# Patient Record
Sex: Female | Born: 1960
Health system: Southern US, Community
[De-identification: ages and names within clinical notes are randomized; demographics above are authoritative.]

## PROBLEM LIST (undated history)

## (undated) DIAGNOSIS — E785 Hyperlipidemia, unspecified: Secondary | ICD-10-CM

## (undated) DIAGNOSIS — Z8489 Family history of other specified conditions: Secondary | ICD-10-CM

## (undated) DIAGNOSIS — M1990 Unspecified osteoarthritis, unspecified site: Secondary | ICD-10-CM

## (undated) DIAGNOSIS — R112 Nausea with vomiting, unspecified: Secondary | ICD-10-CM

## (undated) DIAGNOSIS — J45909 Unspecified asthma, uncomplicated: Secondary | ICD-10-CM

## (undated) DIAGNOSIS — Z9889 Other specified postprocedural states: Secondary | ICD-10-CM

## (undated) DIAGNOSIS — I10 Essential (primary) hypertension: Secondary | ICD-10-CM

## (undated) DIAGNOSIS — F411 Generalized anxiety disorder: Secondary | ICD-10-CM

## (undated) DIAGNOSIS — T8859XA Other complications of anesthesia, initial encounter: Secondary | ICD-10-CM

## (undated) DIAGNOSIS — Z8719 Personal history of other diseases of the digestive system: Secondary | ICD-10-CM

## (undated) DIAGNOSIS — F17200 Nicotine dependence, unspecified, uncomplicated: Secondary | ICD-10-CM

## (undated) DIAGNOSIS — D649 Anemia, unspecified: Secondary | ICD-10-CM

## (undated) DIAGNOSIS — R002 Palpitations: Secondary | ICD-10-CM

## (undated) DIAGNOSIS — E042 Nontoxic multinodular goiter: Secondary | ICD-10-CM

## (undated) DIAGNOSIS — J438 Other emphysema: Secondary | ICD-10-CM

## (undated) DIAGNOSIS — M129 Arthropathy, unspecified: Secondary | ICD-10-CM

## (undated) DIAGNOSIS — T4145XA Adverse effect of unspecified anesthetic, initial encounter: Secondary | ICD-10-CM

## (undated) HISTORY — DX: Nicotine dependence, unspecified, uncomplicated: F17.200

## (undated) HISTORY — DX: Nontoxic multinodular goiter: E04.2

## (undated) HISTORY — DX: Personal history of other diseases of the digestive system: Z87.19

## (undated) HISTORY — DX: Hyperlipidemia, unspecified: E78.5

## (undated) HISTORY — DX: Essential (primary) hypertension: I10

## (undated) HISTORY — DX: Unspecified osteoarthritis, unspecified site: M19.90

## (undated) HISTORY — DX: Unspecified asthma, uncomplicated: J45.909

## (undated) HISTORY — DX: Generalized anxiety disorder: F41.1

## (undated) HISTORY — DX: Other emphysema: J43.8

## (undated) HISTORY — DX: Arthropathy, unspecified: M12.9

## (undated) HISTORY — PX: BREAST BIOPSY: SHX20

---

## 1996-12-11 HISTORY — PX: JOINT REPLACEMENT: SHX530

## 1998-05-09 ENCOUNTER — Inpatient Hospital Stay (HOSPITAL_COMMUNITY): Admission: AD | Admit: 1998-05-09 | Discharge: 1998-05-09 | Payer: Self-pay | Admitting: Obstetrics and Gynecology

## 1998-05-25 ENCOUNTER — Inpatient Hospital Stay (HOSPITAL_COMMUNITY): Admission: AD | Admit: 1998-05-25 | Discharge: 1998-05-27 | Payer: Self-pay | Admitting: *Deleted

## 1998-05-29 ENCOUNTER — Encounter (HOSPITAL_COMMUNITY): Admission: RE | Admit: 1998-05-29 | Discharge: 1998-08-27 | Payer: Self-pay | Admitting: Obstetrics and Gynecology

## 1998-07-12 ENCOUNTER — Other Ambulatory Visit: Admission: RE | Admit: 1998-07-12 | Discharge: 1998-07-12 | Payer: Self-pay | Admitting: Obstetrics and Gynecology

## 1999-12-28 ENCOUNTER — Ambulatory Visit (HOSPITAL_COMMUNITY): Admission: RE | Admit: 1999-12-28 | Discharge: 1999-12-28 | Payer: Self-pay | Admitting: Obstetrics and Gynecology

## 1999-12-28 ENCOUNTER — Encounter: Payer: Self-pay | Admitting: Obstetrics and Gynecology

## 2000-04-23 ENCOUNTER — Ambulatory Visit (HOSPITAL_BASED_OUTPATIENT_CLINIC_OR_DEPARTMENT_OTHER): Admission: RE | Admit: 2000-04-23 | Discharge: 2000-04-23 | Payer: Self-pay | Admitting: Surgery

## 2000-06-16 ENCOUNTER — Encounter: Payer: Self-pay | Admitting: Emergency Medicine

## 2000-06-16 ENCOUNTER — Emergency Department (HOSPITAL_COMMUNITY): Admission: EM | Admit: 2000-06-16 | Discharge: 2000-06-16 | Payer: Self-pay | Admitting: Emergency Medicine

## 2000-12-11 HISTORY — PX: OTHER SURGICAL HISTORY: SHX169

## 2001-06-03 ENCOUNTER — Ambulatory Visit (HOSPITAL_COMMUNITY): Admission: RE | Admit: 2001-06-03 | Discharge: 2001-06-03 | Payer: Self-pay | Admitting: Cardiology

## 2001-06-04 ENCOUNTER — Encounter: Payer: Self-pay | Admitting: Family Medicine

## 2001-06-19 ENCOUNTER — Ambulatory Visit (HOSPITAL_COMMUNITY): Admission: RE | Admit: 2001-06-19 | Discharge: 2001-06-19 | Payer: Self-pay | Admitting: Family Medicine

## 2001-06-19 ENCOUNTER — Encounter: Payer: Self-pay | Admitting: Family Medicine

## 2002-05-19 ENCOUNTER — Encounter: Admission: RE | Admit: 2002-05-19 | Discharge: 2002-05-19 | Payer: Self-pay | Admitting: Emergency Medicine

## 2002-05-19 ENCOUNTER — Encounter: Payer: Self-pay | Admitting: Emergency Medicine

## 2002-10-01 ENCOUNTER — Other Ambulatory Visit: Admission: RE | Admit: 2002-10-01 | Discharge: 2002-10-01 | Payer: Self-pay | Admitting: Obstetrics and Gynecology

## 2004-01-12 ENCOUNTER — Other Ambulatory Visit: Admission: RE | Admit: 2004-01-12 | Discharge: 2004-01-12 | Payer: Self-pay | Admitting: Obstetrics and Gynecology

## 2006-12-11 HISTORY — PX: CHOLECYSTECTOMY: SHX55

## 2007-07-01 ENCOUNTER — Observation Stay (HOSPITAL_COMMUNITY): Admission: EM | Admit: 2007-07-01 | Discharge: 2007-07-02 | Payer: Self-pay | Admitting: Emergency Medicine

## 2007-07-01 ENCOUNTER — Ambulatory Visit: Payer: Self-pay | Admitting: Internal Medicine

## 2007-07-01 ENCOUNTER — Encounter: Payer: Self-pay | Admitting: Internal Medicine

## 2007-07-01 LAB — CONVERTED CEMR LAB
Chloride: 105 meq/L
Cholesterol: 211 mg/dL
HDL: 42 mg/dL
Hemoglobin: 12.3 g/dL
Potassium: 3.6 meq/L
Sodium: 141 meq/L
Triglyceride fasting, serum: 158 mg/dL
WBC: 9.5 10*3/uL

## 2007-07-10 ENCOUNTER — Ambulatory Visit: Payer: Self-pay

## 2007-07-18 ENCOUNTER — Encounter: Payer: Self-pay | Admitting: Cardiology

## 2007-07-18 ENCOUNTER — Ambulatory Visit (HOSPITAL_COMMUNITY): Admission: RE | Admit: 2007-07-18 | Discharge: 2007-07-18 | Payer: Self-pay | Admitting: Internal Medicine

## 2007-07-18 ENCOUNTER — Ambulatory Visit: Payer: Self-pay

## 2007-07-19 ENCOUNTER — Ambulatory Visit: Payer: Self-pay | Admitting: Gastroenterology

## 2007-07-19 LAB — CONVERTED CEMR LAB
AST: 40 units/L — ABNORMAL HIGH (ref 0–37)
Albumin: 3.8 g/dL (ref 3.5–5.2)
Amylase: 64 units/L (ref 27–131)
Basophils Relative: 0.4 % (ref 0.0–1.0)
Bilirubin, Direct: 0.1 mg/dL (ref 0.0–0.3)
Eosinophils Relative: 8.4 % — ABNORMAL HIGH (ref 0.0–5.0)
HCT: 35.9 % — ABNORMAL LOW (ref 36.0–46.0)
Hemoglobin: 12.1 g/dL (ref 12.0–15.0)
Lipase: 35 units/L (ref 11.0–59.0)
Lymphocytes Relative: 28.9 % (ref 12.0–46.0)
Monocytes Absolute: 0.4 10*3/uL (ref 0.2–0.7)
Monocytes Relative: 8.4 % (ref 3.0–11.0)
Neutro Abs: 2.3 10*3/uL (ref 1.4–7.7)
Neutrophils Relative %: 53.9 % (ref 43.0–77.0)
Total Bilirubin: 0.9 mg/dL (ref 0.3–1.2)
Total Protein: 6.5 g/dL (ref 6.0–8.3)

## 2007-07-20 ENCOUNTER — Ambulatory Visit (HOSPITAL_COMMUNITY): Admission: RE | Admit: 2007-07-20 | Discharge: 2007-07-21 | Payer: Self-pay | Admitting: General Surgery

## 2007-07-20 ENCOUNTER — Encounter: Payer: Self-pay | Admitting: Gastroenterology

## 2007-07-20 ENCOUNTER — Encounter (INDEPENDENT_AMBULATORY_CARE_PROVIDER_SITE_OTHER): Payer: Self-pay | Admitting: General Surgery

## 2007-07-24 ENCOUNTER — Ambulatory Visit: Payer: Self-pay | Admitting: Gastroenterology

## 2007-08-05 ENCOUNTER — Ambulatory Visit: Payer: Self-pay | Admitting: Gastroenterology

## 2007-08-05 LAB — CONVERTED CEMR LAB
Bilirubin, Direct: 0.1 mg/dL (ref 0.0–0.3)
Total Protein: 6.3 g/dL (ref 6.0–8.3)

## 2007-08-23 ENCOUNTER — Ambulatory Visit: Payer: Self-pay | Admitting: Internal Medicine

## 2008-03-18 DIAGNOSIS — J438 Other emphysema: Secondary | ICD-10-CM | POA: Insufficient documentation

## 2008-03-18 DIAGNOSIS — Z8719 Personal history of other diseases of the digestive system: Secondary | ICD-10-CM | POA: Insufficient documentation

## 2008-03-18 DIAGNOSIS — F411 Generalized anxiety disorder: Secondary | ICD-10-CM | POA: Insufficient documentation

## 2008-03-18 DIAGNOSIS — Z96649 Presence of unspecified artificial hip joint: Secondary | ICD-10-CM

## 2008-03-18 DIAGNOSIS — J439 Emphysema, unspecified: Secondary | ICD-10-CM | POA: Insufficient documentation

## 2008-03-18 DIAGNOSIS — Z9089 Acquired absence of other organs: Secondary | ICD-10-CM | POA: Insufficient documentation

## 2008-03-18 DIAGNOSIS — M1990 Unspecified osteoarthritis, unspecified site: Secondary | ICD-10-CM | POA: Insufficient documentation

## 2008-03-18 DIAGNOSIS — E079 Disorder of thyroid, unspecified: Secondary | ICD-10-CM | POA: Insufficient documentation

## 2008-09-06 IMAGING — CR DG CHEST 1V PORT
1 series · 1 of 1 positions shown · non-contrast
Comparison: none

CLINICAL DATA: 45 year-old-male with chest pain. 
 PORTABLE CHEST - 1 VIEW ? 07/01/07:

[view not recorded]
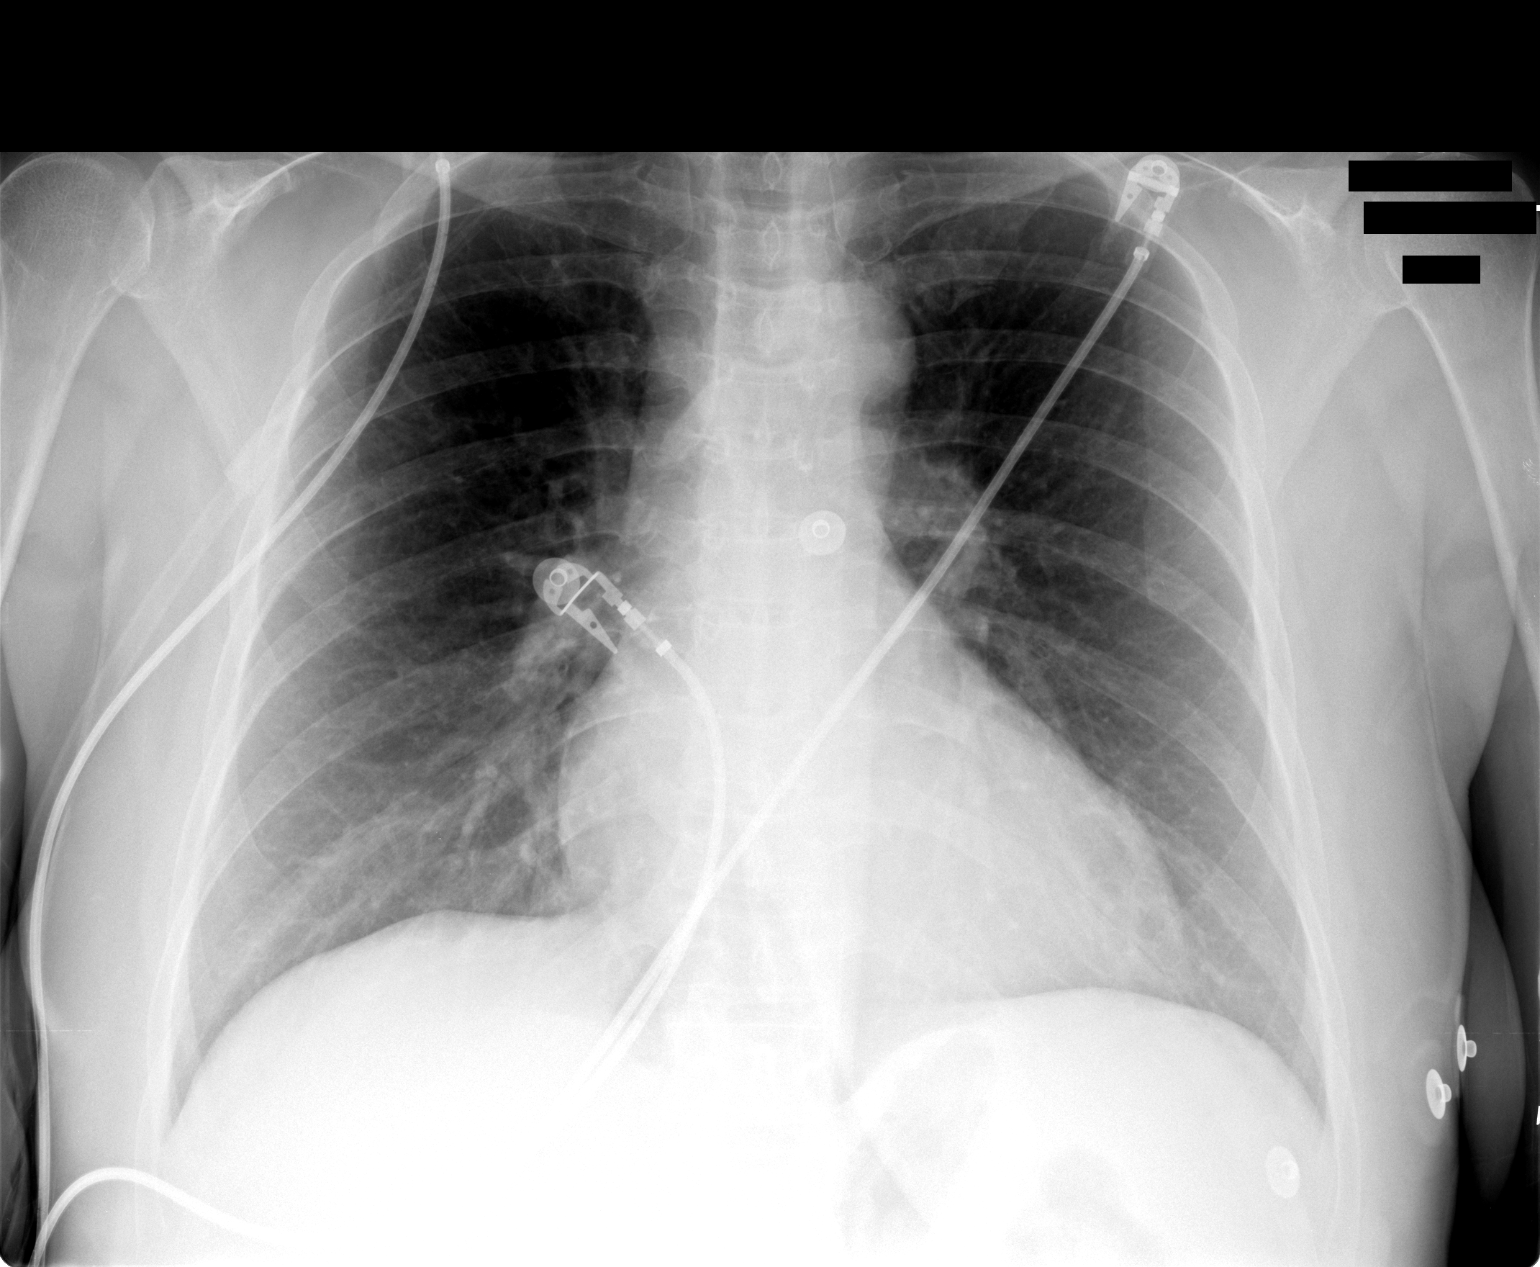

[1 of 1 positions shown; findings below may reference images not displayed]

FINDINGS: A single view of the chest demonstrates clear lungs.  The heart and mediastinum are within normal limits.  The trachea is midline.  The bony structures are intact without pneumothorax.
IMPRESSION: No acute chest findings.

## 2009-09-10 LAB — CONVERTED CEMR LAB: Pap Smear: NEGATIVE

## 2009-10-13 ENCOUNTER — Encounter: Payer: Self-pay | Admitting: Internal Medicine

## 2009-10-13 LAB — CONVERTED CEMR LAB
ALT: 11 units/L
Alkaline Phosphatase: 93 units/L
BUN: 21 mg/dL
Chloride: 101 meq/L
Cholesterol: 264 mg/dL
Glucose, Bld: 91 mg/dL
HDL: 36 mg/dL
LDL (calc): 188 mg/dL
Potassium: 4.2 meq/L
Total Bilirubin: 0.4 mg/dL
Triglyceride fasting, serum: 199 mg/dL

## 2009-11-09 ENCOUNTER — Encounter: Payer: Self-pay | Admitting: Internal Medicine

## 2009-11-09 DIAGNOSIS — E785 Hyperlipidemia, unspecified: Secondary | ICD-10-CM

## 2009-11-09 DIAGNOSIS — J45909 Unspecified asthma, uncomplicated: Secondary | ICD-10-CM | POA: Insufficient documentation

## 2009-11-09 DIAGNOSIS — F17200 Nicotine dependence, unspecified, uncomplicated: Secondary | ICD-10-CM

## 2009-11-10 ENCOUNTER — Ambulatory Visit: Payer: Self-pay | Admitting: Internal Medicine

## 2009-11-10 DIAGNOSIS — I10 Essential (primary) hypertension: Secondary | ICD-10-CM | POA: Insufficient documentation

## 2009-11-10 DIAGNOSIS — M129 Arthropathy, unspecified: Secondary | ICD-10-CM | POA: Insufficient documentation

## 2009-12-08 ENCOUNTER — Encounter: Payer: Self-pay | Admitting: Internal Medicine

## 2009-12-20 ENCOUNTER — Ambulatory Visit: Payer: Self-pay | Admitting: Internal Medicine

## 2010-02-09 ENCOUNTER — Ambulatory Visit: Payer: Self-pay | Admitting: Internal Medicine

## 2010-04-22 ENCOUNTER — Ambulatory Visit: Payer: Self-pay | Admitting: Internal Medicine

## 2010-04-22 ENCOUNTER — Encounter (INDEPENDENT_AMBULATORY_CARE_PROVIDER_SITE_OTHER): Payer: Self-pay | Admitting: *Deleted

## 2010-04-22 DIAGNOSIS — R110 Nausea: Secondary | ICD-10-CM

## 2010-04-24 ENCOUNTER — Emergency Department (HOSPITAL_COMMUNITY): Admission: EM | Admit: 2010-04-24 | Discharge: 2010-04-24 | Payer: Self-pay | Admitting: Emergency Medicine

## 2010-05-26 ENCOUNTER — Encounter (INDEPENDENT_AMBULATORY_CARE_PROVIDER_SITE_OTHER): Payer: Self-pay | Admitting: *Deleted

## 2010-10-19 ENCOUNTER — Ambulatory Visit: Payer: Self-pay | Admitting: Internal Medicine

## 2010-10-20 LAB — CONVERTED CEMR LAB
AST: 16 units/L (ref 0–37)
Alkaline Phosphatase: 72 units/L (ref 39–117)
BUN: 12 mg/dL (ref 6–23)
Bilirubin, Direct: 0.1 mg/dL (ref 0.0–0.3)
CO2: 33 meq/L — ABNORMAL HIGH (ref 19–32)
Calcium: 9.3 mg/dL (ref 8.4–10.5)
Chloride: 102 meq/L (ref 96–112)
Cholesterol: 200 mg/dL (ref 0–200)
Creatinine, Ser: 0.6 mg/dL (ref 0.4–1.2)
Glucose, Bld: 94 mg/dL (ref 70–99)
Total CHOL/HDL Ratio: 5
VLDL: 41 mg/dL — ABNORMAL HIGH (ref 0.0–40.0)

## 2011-01-10 NOTE — Letter (Signed)
Summary: New Patient letter  [facility]  [address] [place], [place] [address]   Phone: [phone]  Fax: [phone]       04/22/2010 MRN: [id number]  [name] [name] [address] [place], [place]  [address]  Dear Ms. Rosamaria Lints,  Welcome to the Gastroenterology Division at [facility].    You are scheduled to see Dr.   Christella Hartigan on May 31, 2010 at 10am on the 3rd floor at [facility], [address]  We ask that you try to arrive at our office 15 minutes prior to your appointment time to allow for check-in.  We would like you to complete the enclosed self-administered evaluation form prior to your visit and bring it with you on the day of your appointment.  We will review it with you.  Also, please bring a complete list of all your medications or, if you prefer, bring the medication bottles and we will list them.  Please bring your insurance card so that we may make a copy of it.  If your insurance requires a referral to see a specialist, please bring your referral form from your primary care physician.  Co-payments are due at the time of your visit and may be paid by cash, check or credit card.     Your office visit will consist of a consult with your physician (includes a physical exam), any laboratory testing he/she may order, scheduling of any necessary diagnostic testing (e.g. x-ray, ultrasound, CT-scan), and scheduling of a procedure (e.g. Endoscopy, Colonoscopy) if required.  Please allow enough time on your schedule to allow for any/all of these possibilities.    If you cannot keep your appointment, please call [phone] to cancel or reschedule prior to your appointment date.  This allows [place] the opportunity to schedule an appointment for another patient in need of care.  If you do not cancel or reschedule by 5 p.m. the business day prior to your appointment date, you will be charged a $50.00 late cancellation/no-show fee.    Thank you for choosing  [facility] for your medical needs.  We appreciate the opportunity to care for you.  Please visit [place] at our website  to learn more about our practice.                     Sincerely,                                                             The Gastroenterology Division

## 2011-01-10 NOTE — Assessment & Plan Note (Signed)
Summary: 6 MTH FU  STC   Vital Signs:  Patient profile:   50 year old female Height:      64.5 inches (163.83 cm) Weight:      180.5 pounds (82.05 kg) BMI:     30.61 O2 Sat:      96 % on Room air Temp:     97.3 degrees F (36.28 degrees C) oral Pulse rate:   73 / minute BP sitting:   112 / 78  (left arm) Cuff size:   large  Vitals Entered By: Orlan Leavens RMA (October 19, 2010 11:08 AM)  O2 Flow:  Room air CC: 6 MONTH FOLLOW-UP Is Patient Diabetic? No Pain Assessment Patient in pain? no      Comments Need refills on meds   Primary Care Provider:  Newt Lukes MD  CC:  6 MONTH FOLLOW-UP.  History of Present Illness: here for f/u  1) dyslipidemia -begun on statin 11/2009 - reports compliance with ongoing medical treatment and no changes in medication dose or frequency. denies adverse side effects related to current therapy. no new GI or mskel c/o  2) HTN - reports compliance with ongoing medical treatment and no changes in medication dose or frequency. denies adverse side effects related to current therapy. no CP, SOB or edema  3) smoker - on/off tobacco use for years denies any symptoms of cough or SOB - would like to quit - but not ready - remote hx asthma but no recent pulm flares or symptoms   4) occ nausea -  no abd pain, no v or bowel changes - feels maybe related to acid and reflux - improved with OTC meds for same - prior appt with GI but cancelled it x 2 in 2011  Clinical Review Panels:  Prevention   Last Mammogram:  No specific mammographic evidence of malignancy.   (09/10/2009)   Last Pap Smear:  Interpretation/Result:Negative for intraepithelial Lesion or Malignancy.    (09/10/2009)  Immunizations   Last Flu Vaccine:  Fluvax 3+ (11/10/2009)   Last Pneumovax:  Historical (12/12/2007)  Lipid Management   Cholesterol:  264 (10/13/2009)   LDL (bad choesterol):  40 (10/13/2009)   HDL (good cholesterol):  36 (10/13/2009)   Triglycerides:  199  (10/13/2009)  Complete Metabolic Panel   Glucose:  91 (10/13/2009)   Sodium:  142 (10/13/2009)   Potassium:  4.2 (10/13/2009)   Chloride:  101 (10/13/2009)   CO2:  30 (07/01/2007)   BUN:  21 (10/13/2009)   Creatinine:  0.77 (10/13/2009)   Albumin:  3.6 (08/05/2007)   Total Protein:  6.4 (10/13/2009)   Calcium:  9.7 (10/13/2009)   Total Bili:  0.4 (10/13/2009)   Alk Phos:  93 (10/13/2009)   SGPT (ALT):  11 (10/13/2009)   SGOT (AST):  10 (10/13/2009)   Current Medications (verified): 1)  Bayer Low Strength 81 Mg Tbec (Aspirin) .... Take 1 Po Qd 2)  Hydrochlorothiazide 12.5 Mg Tabs (Hydrochlorothiazide) .Marland Kitchen.. 1 By Mouth Once Daily 3)  Pravastatin Sodium 20 Mg Tabs (Pravastatin Sodium) .Marland Kitchen.. 1 By Mouth At Bedtime 4)  Pepcid 20 Mg Tabs (Famotidine) .Marland Kitchen.. 1 By Mouth Once Daily  Allergies (verified): 1)  ! Codeine  Past History:  Past Medical History: Asthma Hyperlipidemia Hypertension Vit D defic  MD roster: gyn - [facility] cards - [facility] - [facility]  Review of Systems  The patient denies fever, weight gain, chest pain, syncope, and peripheral edema.    Physical Exam  General:  overweight-appearing.  alert, well-developed, well-nourished, and cooperative to examination.    Neck:  slightly shoddy left cervical LNs x 3 (also tender over left jaw with ongoing dental eval and tx, implants) Lungs:  normal respiratory effort, no intercostal retractions or use of accessory muscles; normal breath sounds bilaterally - no crackles and no wheezes.    Heart:  normal rate, regular rhythm, no murmur, and no rub. BLE without edema.   Impression & Recommendations:  Problem # 1:  HYPERLIPIDEMIA (ICD-272.4)  started med tx for same 11/2009 - tol well  check labs now Her updated medication list for this problem includes:    Pravastatin Sodium 20 Mg Tabs (Pravastatin sodium) .Marland Kitchen... 1 by mouth at bedtime  Orders: TLB-Lipid Panel (80061-LIPID) Tobacco use cessation intermediate 3-10 minutes  ([address])  Labs Reviewed: SGOT: 10 (10/13/2009)   SGPT: 11 (10/13/2009)   HDL:36 (10/13/2009), 42 (07/01/2007)  LDL:188 (10/13/2009), 40 (60/45/4098)  Chol:264 (10/13/2009), 211 (07/01/2007)  Trig:199 (10/13/2009), 158 (07/01/2007)  Problem # 2:  HYPERTENSION (ICD-401.9)  Her updated medication list for this problem includes:    Hydrochlorothiazide 12.5 Mg Tabs (Hydrochlorothiazide) .Marland Kitchen... 1 by mouth once daily  Orders: TLB-BMP (Basic Metabolic Panel-BMET) (80048-METABOL) Tobacco use cessation intermediate 3-10 minutes ([address])  BP today: 112/78 Prior BP: 122/82 (04/22/2010)  Labs Reviewed: K+: 4.2 (10/13/2009) Creat: : 0.77 (10/13/2009)   Chol: 264 (10/13/2009)   HDL: 36 (10/13/2009)   LDL: 188 (10/13/2009)   TG: 199 (10/13/2009)  Problem # 3:  TOBACCO ABUSE (ICD-305.1)  5 minutes today spent on patient education regarding the unhealthy effects of continued tobacco abuse and encouragment of cessation including medical options available to help patient to quit smoking.   Orders: Tobacco use cessation intermediate 3-10 minutes ([address])  Complete Medication List: 1)  Bayer Low Strength 81 Mg Tbec (Aspirin) .... Take 1 po qd 2)  Hydrochlorothiazide 12.5 Mg Tabs (Hydrochlorothiazide) .Marland Kitchen.. 1 by mouth once daily 3)  Pravastatin Sodium 20 Mg Tabs (Pravastatin sodium) .Marland Kitchen.. 1 by mouth at bedtime 4)  Pepcid 20 Mg Tabs (Famotidine) .Marland Kitchen.. 1 by mouth once daily  Other Orders: TLB-Hepatic/Liver Function Pnl (80076-HEPATIC)  Patient Instructions: 1)  it was good to see you today.  2)  test(s) ordered today - your results will be posted on the phone tree for review in 48-72 hours from the time of test completion; call [phone] and enter your 9 digit MRN (listed above on this page, just below your name); if any changes need to be made or there are abnormal results, you will be contacted directly.  3)  work on giving up those cigarettes as planned - 12/2010 sounds good! 4)  Please schedule a  follow-up appointment in 6 months, sooner if problems.  5)  will plan to schedule a colonoscopy/sigmoidoscopy to help detect colon cancer in the next few visits - it is a recommended screening after age 7 Prescriptions: PRAVASTATIN SODIUM 20 MG TABS (PRAVASTATIN SODIUM) 1 by mouth at bedtime  #30 x 5   Entered by:   Orlan Leavens RMA   Authorized by:   Newt Lukes MD   Signed by:   Orlan Leavens RMA on 10/19/2010   Method used:   Electronically to        [facility] [address] #[id number]* (retail)       [address]       [place], [place]  [address]       Ph: [phone]  Fax: [phone]   RxID:   [id number] HYDROCHLOROTHIAZIDE 12.5 MG TABS (HYDROCHLOROTHIAZIDE) 1 by mouth once daily  #30 x 5   Entered by:   Orlan Leavens RMA   Authorized by:   Newt Lukes MD   Signed by:   Orlan Leavens RMA on 10/19/2010   Method used:   Electronically to        [facility] [address] [phone]* (retail)       [address]       [place], [place]  [address]       Ph: [phone]       Fax: [phone]   RxID:   [id number]    Orders Added: 1)  TLB-Lipid Panel [80061-LIPID] 2)  TLB-Hepatic/Liver Function Pnl [80076-HEPATIC] 3)  TLB-BMP (Basic Metabolic Panel-BMET) [80048-METABOL] 4)  Est. Patient Level IV [9[address]] 5)  Tobacco use cessation intermediate 3-10 minutes [[address]]

## 2011-01-10 NOTE — Assessment & Plan Note (Signed)
Summary: F2Y/CHEST PAIN      Allergies Added: ! CODEINE  Visit Type:  Follow-up 2 years Primary Provider:  Newt Lukes MD  CC:  arm pain pt went to urgent care. It was suggested that pt come in for a follow visit because it has been a long time. Pt does smoke 1/2 a pack a day.Marland Kitchen  History of Present Illness: Patient is a 50 year old with a history of chest pain, dyslipidemia, hypertension and tobacco use.  I saw her 2 years ago.  Pain at that time was probable GI as it improved after a cholecystectomy. She was recently seen in urgent care for arm/shoulder pain.  She said the pain was worse with movement of her L arm.  It has since gone away.  She denies ofther chest pains.  No wheezing.  No SOB.  She continues to smoke.  Current Medications (verified): 1)  Bayer Low Strength 81 Mg Tbec (Aspirin) .... Take 1 Po Qd 2)  Hydrochlorothiazide 12.5 Mg Tabs (Hydrochlorothiazide) .Marland Kitchen.. 1 By Mouth Once Daily 3)  Pravastatin Sodium 20 Mg Tabs (Pravastatin Sodium) .Marland Kitchen.. 1 By Mouth At Bedtime  Allergies (verified): 1)  ! Codeine  Past History:  Past Medical History: Last updated: 11/10/2009 Asthma Hyperlipidemia Hypertension  Past Surgical History: Last updated: 11/09/2009 Total hip replacement (right), 1998 Cholecystectomy Thyroid biopsy  Family History: Last updated: 11/10/2009 Family History of Alcoholism/Addiction (parent) Family History of Arthritis (parent) Family History of Colon CA 1st degree relative <60 (grandparent) Family History High cholesterol (both PARENTS) Family History Hypertension (both parents) Family History Lung cancer (grandparent)  mom age 49 dad age 70  no CAD or CVA  Social History: Last updated: 11/10/2009 Current Smoker married, lives with spouse and 2 sons 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  Negative to hte above problem except as noted.  Vital Signs:  Patient  profile:   50 year old female Height:      64.5 inches Weight:      183 pounds BMI:     31.04 Pulse rate:   70 / minute BP sitting:   112 / 76  (left arm) Cuff size:   large  Vitals Entered By: Burnett Kanaris, CNA (December 20, 2009 11:27 AM)  Physical Exam  Additional Exam:  HEENT:  Normocephalic, atraumatic. EOMI, PERRLA.  Neck: JVP is normal. No thyromegaly. No bruits.  Lungs: clear to auscultation. No rales no wheezes.  Heart: Regular rate and rhythm. Normal S1, S2. No S3.   No significant murmurs. PMI not displaced.  Abdomen:  Supple, nontender. Normal bowel sounds. No masses. No hepatomegaly.  Extremities:   Good distal pulses throughout. No lower extremity edema.  Musculoskeletal :moving all extremities.  Neuro:   alert and oriented x3.    EKG  Procedure date:  12/20/2009  Findings:      NSR.  71 bpm.  Impression & Recommendations:  Problem # 1:  CHEST PAIN (ICD-786.50) Pain very atypical.  Apperas to have been muscluloskeletal.  COntinue risk factor modification.  Problem # 2:  HYPERTENSION (ICD-401.9) Will need to be followed and have K followed. Her updated medication list for this problem includes:    Bayer Low Strength 81 Mg Tbec (Aspirin) .Marland Kitchen... Take 1 po qd    Hydrochlorothiazide 12.5 Mg Tabs (Hydrochlorothiazide) .Marland Kitchen... 1 by mouth once daily  Problem # 3:  HYPERLIPIDEMIA (ICD-272.4) LDL did increase significantly  over the past few years  (from 137 to 188).  She is now on Pravastatin which is reasonalbe given her fHx and her tobacco use.  Iwill also refer her to  dietary.  At some pt she may be able to back off. Her updated medication list for this problem includes:    Pravastatin Sodium 20 Mg Tabs (Pravastatin sodium) .Marland Kitchen... 1 by mouth at bedtime  Orders: Misc. Referral (Misc. Ref)  Problem # 4:  TOBACCO ABUSE (ICD-305.1) Counselled at length about risks and need to quit.  Other Orders: EKG w/ Interpretation ([address])  Patient Instructions: 1)  An  appointment for you to see the Dietician has been scheduled. Please keep your appointment or be sure to call if you need to reschedule. for high Cholesterol.

## 2011-01-10 NOTE — Letter (Signed)
Summary: Appt Reminder 2  [facility]  [address] [place], [place] [address]   Phone: [phone]  Fax: [phone]        May 26, 2010 MRN: [id number]    [name] [name] [address] [place], [place]  [address]    Dear [name] [name],   You have a return appointment with Dr. Jarold Motto on 06-07-10 at 1:45pm.  The appoinment on June 21 with Dr Christella Hartigan was a scheduling error and has been cancelled. Please remember to bring a complete list of the medicines you are taking, your insurance card and your co-pay.  If you have to cancel or reschedule this appointment, please call before 5:00 pm the evening before to avoid a cancellation fee.  If you have any questions or concerns, please call [phone].    Sincerely, [facility]

## 2011-01-10 NOTE — Assessment & Plan Note (Signed)
Summary: FU ON MEDS/ REFILLS /NWS   Vital Signs:  Patient profile:   50 year old female Height:      64.5 inches (163.83 cm) Weight:      185.4 pounds (84.27 kg) O2 Sat:      97 % on Room air Temp:     97.4 degrees F (36.33 degrees C) oral Pulse rate:   76 / minute BP sitting:   122 / 82  (left arm) Cuff size:   regular  Vitals Entered By: Orlan Leavens (Apr 22, 2010 1:49 PM)  O2 Flow:  Room air CC: follow-up on meds Is Patient Diabetic? No Pain Assessment Patient in pain? no        Primary Care Provider:  Newt Lukes MD  CC:  follow-up on meds.  History of Present Illness: here for f/u  1) dyslipidemia -begun on statin 11/2009 -reports compliance with ongoing medical treatment and no changes in medication dose or frequency. denies adverse side effects related to current therapy. no GI or mskel c/o  2) HTN - reports compliance with ongoing medical treatment and no changes in medication dose or frequency. denies adverse side effects related to current therapy. no CP, SOB or edema  3) smoker - on/off tobacco use for years denies any symptoms pof cough or SOB - would like to quit - but not sure she's ready - remote hx asthma but no recent pulm flares or symptoms   4) c/o continued daily nausea -  no abd pain, no v or bowel changes - feels maybe related to acid and reflux - not taking any OTC meds for same - has appt with GI but cancelled it - ?reschedule -   Clinical Review Panels:  Lipid Management   Cholesterol:  264 (10/13/2009)   LDL (bad choesterol):  40 (10/13/2009)   HDL (good cholesterol):  36 (10/13/2009)   Triglycerides:  199 (10/13/2009)   Current Medications (verified): 1)  Bayer Low Strength 81 Mg Tbec (Aspirin) .... Take 1 Po Qd 2)  Hydrochlorothiazide 12.5 Mg Tabs (Hydrochlorothiazide) .Marland Kitchen.. 1 By Mouth Once Daily 3)  Pravastatin Sodium 20 Mg Tabs (Pravastatin Sodium) .Marland Kitchen.. 1 By Mouth At Bedtime  Allergies (verified): 1)  ! Codeine  Past  History:  Past Medical History: Asthma Hyperlipidemia Hypertension Vit D defic  MD rooster: gyn - [facility] cards - [facility] - [facility]  Review of Systems  The patient denies anorexia, fever, weight loss, chest pain, headaches, and abdominal pain.    Physical Exam  General:  overweight-appearing.  alert, well-developed, well-nourished, and cooperative to examination.    Lungs:  normal respiratory effort, no intercostal retractions or use of accessory muscles; normal breath sounds bilaterally - no crackles and no wheezes.    Heart:  normal rate, regular rhythm, no murmur, and no rub. BLE without edema. Abdomen:  soft, non-tender, normal bowel sounds, no distention; no masses and no appreciable hepatomegaly or splenomegaly.     Impression & Recommendations:  Problem # 1:  HYPERTENSION (ICD-401.9)  Her updated medication list for this problem includes:    Hydrochlorothiazide 12.5 Mg Tabs (Hydrochlorothiazide) .Marland Kitchen... 1 by mouth once daily  BP today: 122/82 Prior BP: 112/76 (12/20/2009)  Labs Reviewed: K+: 4.2 (10/13/2009) Creat: : 0.77 (10/13/2009)   Chol: 264 (10/13/2009)   HDL: 36 (10/13/2009)   LDL: 188 (10/13/2009)   TG: 199 (10/13/2009)  Problem # 2:  HYPERLIPIDEMIA (ICD-272.4)  Her updated medication list for this problem includes:    Pravastatin Sodium 20  Mg Tabs (Pravastatin sodium) .Marland Kitchen... 1 by mouth at bedtime  Orders: TLB-Lipid Panel (80061-LIPID)  Labs Reviewed: SGOT: 10 (10/13/2009)   SGPT: 11 (10/13/2009)   HDL:36 (10/13/2009), 42 (07/01/2007)  LDL:188 (10/13/2009), 40 (16/09/9603)  Chol:264 (10/13/2009), 211 (07/01/2007)  Trig:199 (10/13/2009), 158 (07/01/2007)  Problem # 3:  NAUSEA (ICD-787.02)  Discussed symptom control.  try otc pepcid for poss GERD and refer to GI  Orders: Gastroenterology Referral (GI)  Complete Medication List: 1)  Bayer Low Strength 81 Mg Tbec (Aspirin) .... Take 1 po qd 2)  Hydrochlorothiazide 12.5 Mg Tabs (Hydrochlorothiazide) .Marland Kitchen..  1 by mouth once daily 3)  Pravastatin Sodium 20 Mg Tabs (Pravastatin sodium) .Marland Kitchen.. 1 by mouth at bedtime 4)  Pepcid 20 Mg Tabs (Famotidine) .Marland Kitchen.. 1 by mouth once daily  Other Orders: TLB-Hepatic/Liver Function Pnl (80076-HEPATIC)  Patient Instructions: 1)  it was good to see you today. 2)  return next week in AM fasting for labs only - your results will be posted on the phone tree for review in 48-72 hours from the time of test completion; call [phone] and enter your 9 digit MRN (listed above on this page, just below your name); if any changes need to be made or there are abnormal results, you will be contacted directly.  3)  refills on medications as discussed - 4)  take OTC pepcid once daily for stomach  5)  also we'll make referral to [facility]. Our office will contact you regarding this appointment once made.  6)  Please schedule a follow-up appointment in 6 months, sooner if problems.  Prescriptions: PRAVASTATIN SODIUM 20 MG TABS (PRAVASTATIN SODIUM) 1 by mouth at bedtime  #30 x 5   Entered by:   Orlan Leavens   Authorized by:   Newt Lukes MD   Signed by:   Orlan Leavens on 04/22/2010   Method used:   Electronically to        [facility] #[id number]* (retail)       [address]       [place], [place]  [address]       Ph: [phone]       Fax: [phone]   RxID:   [id number] HYDROCHLOROTHIAZIDE 12.5 MG TABS (HYDROCHLOROTHIAZIDE) 1 by mouth once daily  #30 x 5   Entered by:   Orlan Leavens   Authorized by:   Newt Lukes MD   Signed by:   Orlan Leavens on 04/22/2010   Method used:   Electronically to        [facility] #[id number]* (retail)       [address]       [place], [place]  [address]       Ph: [phone]       Fax: [phone]   RxID:   [id number]

## 2011-02-27 LAB — CBC
MCHC: 34.2 g/dL (ref 30.0–36.0)
MCV: 92 fL (ref 78.0–100.0)
RBC: 4.19 MIL/uL (ref 3.87–5.11)
RDW: 14.1 % (ref 11.5–15.5)

## 2011-02-27 LAB — BASIC METABOLIC PANEL
BUN: 9 mg/dL (ref 6–23)
CO2: 27 mEq/L (ref 19–32)
Calcium: 9 mg/dL (ref 8.4–10.5)
Chloride: 107 mEq/L (ref 96–112)
Creatinine, Ser: 0.63 mg/dL (ref 0.4–1.2)
Glucose, Bld: 106 mg/dL — ABNORMAL HIGH (ref 70–99)

## 2011-02-27 LAB — POCT CARDIAC MARKERS
Myoglobin, poc: 58 ng/mL (ref 12–200)
Troponin i, poc: <0.05 ng/mL (ref 0.00–0.09)
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
## 2011-04-25 NOTE — Assessment & Plan Note (Signed)
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 Tenny Craw for evaluation of chest and abdominal pain.   This patient was hospitalized on July 21 with acute onset of subxiphoid  abdominal pain with nausea and vomiting.  She had a complete cardiac  work-up including exercise testing that was unremarkable.  She was  empirically treated with Protonix but continues to have problems and has  recently increased her dose to twice a day.  Review of her chart from  the hospitalization shows that she had rather markedly abnormal liver  function tests at that time, an SGOT of 235 and an SGPT of 107 with a  normal alk phos and bilirubin.  CBC and metabolic profile otherwise was  unremarkable.  Cardiac enzymes were negative.  Ultrasound exam showed  multiple gallstones with a 7 mm common bile duct but no dilated  intrahepatic ducts.   Since her discharge she continues to have episodes of epigastric pain  lasting four to five hours with nausea and vomiting.  She is taking  twice a day Protonix.  Denies any previous or chronic reflux symptoms,  dysphagia, etc.  She has not had any clay-colored stools, dark urine,  icterus, fever, chills, or lower gastrointestinal problems.  The patient  also additionally had a 2-D echocardiogram obtained of her heart which  showed normal left ventricular function.   PAST MEDICAL HISTORY:  1. Remarkable for mild emphysema.  2. Hypercholesterolemia.  3. Chronic thyroid dysfunction.  4. Degenerative arthritis.  5. History of anxiety disorder.  6. She has had a previous right hip replacement.   MEDICATIONS:  Daily Effexor, Crestor, and twice a day Protonix.   ALLERGIES:  She denies drug allergies.   FAMILY HISTORY:  Remarkable for gallbladder disease in her grandmother.  Her grandmother also had colon cancer and her father apparently has had  colon polyps.  Her father additional has alcoholism.   SOCIAL HISTORY:  She is married, lives with her husband.  Has a college  degree.  She had not smoked in the last week but used to smoke rather  heavily.  She drinks alcohol socially.   REVIEW OF SYSTEMS:  Otherwise noncontributory.  She has not had her  menstrual period in eight months.  She denies any other cardiovascular,  pulmonary, genitourinary, neurologic, or orthopedic problems.   PHYSICAL EXAMINATION:  GENERAL:  She is a healthy-appearing white  female, appearing her stated age.  No acute distress.  VITAL SIGNS:  She is 5 feet 4 inches and weighs 170 pounds.  Blood  pressure 122/66, pulse 68 and regular.  I could not appreciate stigmata  of chronic liver disease or thyromegaly or lymphadenopathy.  CHEST:  Clear.  HEART:  Regular rhythm without murmurs, gallops or rubs.  ABDOMEN:  No hepatosplenomegaly, abdominal masses or tenderness.  Bowel  sounds were normal.  EXTREMITIES:  Unremarkable.  MENTAL STATUS:  Normal.   ASSESSMENT:  [name] [name] has symptomatic gallstones and probably passed a  gallstone recently with elevated liver function tests, slightly  prominent common bile duct.   RECOMMENDATIONS:  I have referred her to surgery for hopefully  cholecystectomy before she has complications from her cholelithiasis.  I  will repeat her liver function tests, CBC, amylase, and lipase today.  She may need to ERCP pre or post surgical procedure depending on  laboratory evaluation.  I have asked her to continue Protonix twice a  day.  I have given her some p.r.n. sublingual Levsin to use in the  interim.     Vania Rea. Jarold Motto, MD, [facility], FAGA  Electronically Signed    DRP/MedQ  DD: 07/19/2007  DT: 07/19/2007  Job #: [id number]   cc:   Pricilla Riffle, MD, [facility]
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
## 2011-04-25 NOTE — Op Note (Signed)
NAMEANALYA, [name] [name]                    ACCOUNT NO.:  [id number]   MEDICAL RECORD NO.:  [id number]          PATIENT TYPE:  OIB   LOCATION:  1535                         FACILITY:  [facility]   PHYSICIAN:  Anselm Pancoast. Weatherly, M.D.DATE OF BIRTH:  Oct 29, 1961   DATE OF PROCEDURE:  07/20/2007  DATE OF DISCHARGE:  07/21/2007                               OPERATIVE REPORT   PREOPERATIVE DIAGNOSIS:  Chronic cholecystitis and abnormal liver  function studies.   POSTOPERATIVE DIAGNOSIS:  Chronic cholecystitis with stones in the  common bile duct.   OPERATIONS:  Laparoscopic cholecystectomy with cholangiogram.   ANESTHESIA:  General anesthesia.   HISTORY:  The patient is a 50 year old Caucasian female who has had  intermittent episodes of upper abdominal and chest discomfort and  actually had an episode approximately 2 weeks ago when she was evaluated  with 24-hour cardiac evaluation at [facility]. No cardiac  abnormalities were noted and no definite mention was made since then she  has had an episode of pain and was referred for an ultrasound that  showed multiple small stones within her gallbladder.  She was referred  to our office yesterday by Dr. Sheryn Bison after he saw her and she  was seen in the office of Dr. Earlene Plater who scheduled her for an urgent  cholecystectomy today. Her liver function studies preoperatively are  markedly improved and her alkaline phosphatase is now 140 and her SGOT  is actually normal, still mildly elevated SGPT. She was given 3 grams  Unasyn, PAS stockings and taken to the operative suite.  The patient has  reviewed the little booklet about the ERCP and gallbladder detail and  understands that she may need ERCP if she does have common duct stones.   DESCRIPTION OF PROCEDURE:  Induction of general anesthesia endotracheal  tube, oral tube to the stomach and abdomen was prepped Betadine solution  and draped in sterile manner.  A small incision was made below the  umbilicus and upon entering the peritoneal cavity, pursestring suture  made and the camera inserted after Hasson cannula had been inserted.  She has a full gallbladder with adhesions up around it and is not  acutely inflamed.  The upper 10 mm trocar was placed subxiphoid area and  then two lateral 5 mm trocars were placed Dr. Ezzard Standing. The gallbladder  was retracted upward and outward with the adhesions were carefully taken  down. In the proximal portion of the gallbladder you could see definite  stones in the most distal portion of the cystic duct and these were  milked back into the gallbladder.  We carefully dissected the junction  of the gallbladder cystic duct and encompassed it with a right angle and  put a clip across it after the stones had been pushed back into the  gallbladder itself.  The cystic artery was identified, separated  carefully and then doubly clipped proximally, singly distally and  divided.  We had a nice window behind the cystic duct then made a little  small opening. The [facility] catheter was introduced.  The dye was injected  and on the distal portion of the common bile duct.  You could see three  or possibly four small stones. There was flow into the duodenum.  A  definite stones.  The intrahepatic radicles could be visualized and were  thought to be unremarkable.  The catheter was removed. The cystic duct  proximally was triply clipped and divided and then the gallbladder was  freed from its bed, placed into an EndoCatch bag and with the camera  switched to the upper 10 mm port withdrawn without problems.  I put an  additional figure-of-eight suture in the fascia at the umbilicus tied  both it and pursestring and anesthetized with Marcaine. The upper 10 mL  trocar I put  one stitch in the anterior fascia with figure-of-eight of 0 Vicryl and  the subcutaneous wounds were closed with 4-0 Vicryl.  Benzoin and Steri-  Strips on skin.  The patient tolerated procedure  nicely and we will  arrange for her to be seen by the gastroenterologist for ERCP probably  tomorrow.           ______________________________  Anselm Pancoast. Zachery Dakins, M.D.     WJW/MEDQ  D:  07/20/2007  T:  07/21/2007  Job:  [id number]   cc:   Vania Rea. Jarold Motto, MD, FACG, FACP, FAGA  [address]  [place]  [place] [address]

## 2011-04-25 NOTE — Assessment & Plan Note (Signed)
[facility]                            CARDIOLOGY OFFICE NOTE   NAME:[name], [name]                    MRN:          [id number]  DATE:08/23/2007                            DOB:          [birth year]    IDENTIFICATION:  The patient is a 50 year old woman who underwent  extensive evaluation for chest pain.  She was seen by cardiology back in  the summer and actually went on to see GI.  Went on to have a  cholecystectomy.  She presented today for return.   Since surgery, she has done well.  She denies chest pain.  She is  active.  No shortness of breath.   She continues to smoke about a pack per day.   CURRENT MEDICATIONS:  1. Effexor 1 daily.  2. Aspirin 81 mg daily.   PHYSICAL EXAM:  On exam, the patient is in no distress.  Blood pressure 124/88, pulse 76, weight 176 pounds.  LUNGS:  Clear.  CARDIAC:  Regular rate and rhythm.  S1, S2.  No S3.  No murmurs.  ABDOMEN:  Benign.  EXTREMITIES:  No edema.   IMPRESSION:  1. Chest pain, abdominal pain appear to be gastrointestinal with gall      stones, status post cholecystectomy.  2. Health care maintenance.  Counseled for greater than 20 minutes on      smoking.  We have reviewed her lipid panel.  Her LDL was 137.  I      think dietary measures would be in order.   I have encouraged her to seek continued followup for her risk factors,  given her the number for smoking cessation program at [facility] through the [employer], and also with website.   I will not set a definite followup unless her symptoms change.  I am  concerned that she is no longer on aspirin.  I would be happy to see her  if problems arise in the future.     [name] Riffle, MD, [facility]  Electronically Signed    PVR/MedQ  DD: 08/24/2007  DT: 08/25/2007  Job #: [id number]   cc:   [name] [name], M.D.

## 2011-04-26 ENCOUNTER — Other Ambulatory Visit: Payer: Self-pay | Admitting: Internal Medicine

## 2011-04-26 NOTE — Telephone Encounter (Signed)
Rx [2] Done. 

## 2011-04-27 ENCOUNTER — Other Ambulatory Visit: Payer: Self-pay | Admitting: *Deleted

## 2011-04-27 MED ORDER — PRAVASTATIN SODIUM 20 MG PO TABS: 20.0000 mg | ORAL_TABLET | Freq: Every day | ORAL | Status: DC

## 2011-04-27 MED ORDER — HYDROCHLOROTHIAZIDE 12.5 MG PO TABS: 12.5000 mg | ORAL_TABLET | Freq: Every day | ORAL | Status: DC

## 2011-07-28 ENCOUNTER — Encounter: Payer: Self-pay | Admitting: Internal Medicine

## 2011-09-25 LAB — LIPID PANEL
Cholesterol: 211 — ABNORMAL HIGH
HDL: 42

## 2011-09-25 LAB — COMPREHENSIVE METABOLIC PANEL
ALT: 187 — ABNORMAL HIGH
AST: 32
Albumin: 3.7
Alkaline Phosphatase: 73
BUN: 9
Calcium: 8.8
Chloride: 104
Chloride: 105
Creatinine, Ser: 0.6
GFR calc Af Amer: >60
GFR calc non Af Amer: >60
Glucose, Bld: 102 — ABNORMAL HIGH
Potassium: 3.6
Sodium: 140
Total Bilirubin: 0.6
Total Bilirubin: 0.8

## 2011-09-25 LAB — CARDIAC PANEL(CRET KIN+CKTOT+MB+TROPI)
Relative Index: 1.5
Total CK: 106
Troponin I: 0.01

## 2011-09-25 LAB — CBC
HCT: 35.7 — ABNORMAL LOW
Hemoglobin: 12.3
MCHC: 34.5
Platelets: 208
RDW: 14

## 2011-09-25 LAB — HEMOGLOBIN A1C: Hgb A1c MFr Bld: 5.5

## 2011-09-25 LAB — TSH: TSH: 1.032

## 2011-09-25 LAB — D-DIMER, QUANTITATIVE: D-Dimer, Quant: 0.29

## 2011-10-02 ENCOUNTER — Ambulatory Visit (INDEPENDENT_AMBULATORY_CARE_PROVIDER_SITE_OTHER): Payer: BC Managed Care – PPO | Admitting: Internal Medicine

## 2011-10-02 ENCOUNTER — Encounter: Payer: Self-pay | Admitting: Internal Medicine

## 2011-10-02 VITALS — BP 130/90 | HR 76 | Temp 98.2°F | Ht 65.0 in | Wt 182.4 lb

## 2011-10-02 DIAGNOSIS — Z23 Encounter for immunization: Secondary | ICD-10-CM

## 2011-10-02 DIAGNOSIS — Z79899 Other long term (current) drug therapy: Secondary | ICD-10-CM

## 2011-10-02 DIAGNOSIS — E669 Obesity, unspecified: Secondary | ICD-10-CM

## 2011-10-02 DIAGNOSIS — E079 Disorder of thyroid, unspecified: Secondary | ICD-10-CM

## 2011-10-02 DIAGNOSIS — I10 Essential (primary) hypertension: Secondary | ICD-10-CM

## 2011-10-02 DIAGNOSIS — E785 Hyperlipidemia, unspecified: Secondary | ICD-10-CM

## 2011-10-02 DIAGNOSIS — R079 Chest pain, unspecified: Secondary | ICD-10-CM

## 2011-10-02 MED ORDER — LOSARTAN POTASSIUM 25 MG PO TABS: 25.0000 mg | ORAL_TABLET | Freq: Every day | ORAL | Status: DC

## 2011-10-02 NOTE — Patient Instructions (Signed)
It was good to see you today. Test(s) ordered today. Your results will be called to you after review (48-72hours after test completion). If any changes need to be made, you will be notified at that time. we'll make referral for stress test at [facility]. Our office will contact you regarding appointment(s) once made. Start losartan for blood pressure in addition to other medications - Your prescription(s) have been submitted to your pharmacy. Please take as directed and contact our office if you believe you are having problem(s) with the medication(s).  Please schedule followup in 4-6 weeks for blood pressure check, call sooner if problems.

## 2011-10-02 NOTE — Assessment & Plan Note (Signed)
Started statin 10/2009 - prava Recheck now with LFTs 

## 2011-10-02 NOTE — Assessment & Plan Note (Signed)
Recent elevation DBP>SBP - also stress and anxiety symptoms present Add losartan to ongoing HCTZ for BP control BP Readings from Last 3 Encounters:  10/02/11 130/90  10/19/10 112/78  04/22/10 122/82

## 2011-10-02 NOTE — Assessment & Plan Note (Signed)
bx 2010 for nodule - reports optho requests TFT check Labs entered as requested

## 2011-10-02 NOTE — Progress Notes (Signed)
Subjective:    Patient ID: [name] [name], female    DOB: 18-Oct-1961, 50 y.o.   MRN: [id number]  HPI  here for follow up - reviewed chronic medical issues and multiple new concerns:  dyslipidemia -begun on statin 11/2009 - reports compliance with ongoing medical treatment and no changes in medication dose or frequency. denies adverse side effects related to current therapy. no new GI or mskel pain  HTN - recent increase in BP readings x 2 weeks -reports compliance with ongoing medical treatment and no changes in medication dose or frequency. denies adverse side effects related to current therapy. nocturnal CP x 2 weeks described as tightness , SOB or edema   smoker - ongoing. on/off tobacco use for years  denies any symptoms of cough or SOB -  would like to quit - but not ready -  remote hx asthma but no recent pulm flares or symptoms   occasional nausea - no abd pain, no bowel changes - feels maybe related to acid and reflux - improved with OTC meds (H2B) for same, but not taking for last 3 months - prior appt with GI but cancelled it x 2 in 2011   Past Medical History  Diagnosis Date  . ANXIETY   . ARTHRITIS   . ASTHMA   . CHOLECYSTECTOMY, LAPAROSCOPIC, HX OF   . DEGENERATIVE JOINT DISEASE   . EMPHYSEMA, MILD   . GALLBLADDER DISEASE, HX OF   . HIP REPLACEMENT, RIGHT, HX OF   . HYPERLIPIDEMIA   . HYPERTENSION   . TOBACCO ABUSE   . Vitamin D deficiency    Family History  Problem Relation Age of Onset  . Alcohol abuse Other   . Arthritis Other   . Lung cancer Other   . Hypertension Other   . Hyperlipidemia Other    History  Substance Use Topics  . Smoking status: Current Everyday Smoker  . 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 Alcohol Use: No    Review of Systems Constitutional: Negative for fever or unexpected weight change.  Respiratory: Negative for cough and  shortness of breath.   Cardiovascular: Negative for palpitations or syncope - see HPI  Gastrointestinal: Negative for abdominal pain, no vomiting.  Musculoskeletal: Negative for gait problem or joint swelling.  Skin: Negative for rash.  Neurological: Negative for dizziness or headache.  No other specific complaints in a complete review of systems (except as listed in HPI above).     Objective:   Physical Exam BP 130/90  Pulse 76  Temp(Src) 98.2 F (36.8 C) (Oral)  Ht 5\' 5"  (1.651 m)  Wt 182 lb 6.4 oz (82.736 kg)  BMI 30.35 kg/m2  SpO2 95% Wt Readings from Last 3 Encounters:  10/02/11 182 lb 6.4 oz (82.736 kg)  10/19/10 180 lb 8 oz (81.874 kg)  04/22/10 185 lb 6.4 oz (84.097 kg)   Constitutional: She is overweight. She appears well-developed and well-nourished. No distress.  Neck: Normal range of motion. Neck supple. No JVD present. No thyromegaly present.  Cardiovascular: Normal rate, regular rhythm and normal heart sounds.  No murmur heard. No BLE edema. Pulmonary/Chest: Effort normal and breath sounds normal. No respiratory distress. She has no wheezes.  Psychiatric: She has a mildly anxious mood and affect. Her behavior is normal. Judgment and thought content normal.   Lab Results  Component Value Date   WBC 5.9  04/24/2010   HGB 13.2 04/24/2010   HCT 38.6 04/24/2010   PLT 191 04/24/2010   GLUCOSE 94 10/19/2010   CHOL 200 10/19/2010   TRIG 205.0* 10/19/2010   HDL 39.20 10/19/2010   LDLDIRECT 137.0 10/19/2010   LDLCALC 40 10/13/2009   LDLCALC 188 10/13/2009   ALT 18 10/19/2010   AST 16 10/19/2010   NA 141 10/19/2010   K 4.1 10/19/2010   CL 102 10/19/2010   CREATININE 0.6 10/19/2010   BUN 12 10/19/2010   CO2 33* 10/19/2010   TSH 0.788 10/13/2009   INR 1.0 07/01/2007   HGBA1C  Value: 5.5 (NOTE)   The ADA recommends the following therapeutic goals for glycemic   control related to Hgb A1C measurement:   Goal of Therapy:   < 7.0% Hgb A1C   Action Suggested:  > 8.0% Hgb A1C   Ref:   Diabetes Care, 22, Suppl. 1, 1999 07/01/2007   EKG today: NSR, no ischemic changes    Assessment & Plan:  See problem list. Medications and labs reviewed today. Time spent with pt today 40 minutes, greater than 50% time spent counseling patient on hypertension, CRF and CP, anxiety and medication review. Also review of prior records including 2008 stress test and prior cardiac eval   chest pain, atypical - nocturnal symptoms associated with anxiety symptoms - multiple CRF so will also check stress myoview- last stress treadmill done 2008 - no ischemic change - resume daily h2b and continue med mgmt of crf - smoking cessation advised  Obesity - pt requests a1c to be checked for DM screening

## 2011-10-09 ENCOUNTER — Other Ambulatory Visit: Payer: Self-pay | Admitting: Internal Medicine

## 2011-10-09 ENCOUNTER — Other Ambulatory Visit: Payer: Self-pay | Admitting: *Deleted

## 2011-10-09 ENCOUNTER — Other Ambulatory Visit (INDEPENDENT_AMBULATORY_CARE_PROVIDER_SITE_OTHER): Payer: BC Managed Care – PPO

## 2011-10-09 DIAGNOSIS — E079 Disorder of thyroid, unspecified: Secondary | ICD-10-CM

## 2011-10-09 DIAGNOSIS — Z79899 Other long term (current) drug therapy: Secondary | ICD-10-CM

## 2011-10-09 DIAGNOSIS — E785 Hyperlipidemia, unspecified: Secondary | ICD-10-CM

## 2011-10-09 DIAGNOSIS — E669 Obesity, unspecified: Secondary | ICD-10-CM

## 2011-10-09 LAB — LIPID PANEL
Cholesterol: 200 mg/dL (ref 0–200)
HDL: 41.8 mg/dL (ref >39.00)
Total CHOL/HDL Ratio: 5
Triglycerides: 243 mg/dL — ABNORMAL HIGH (ref 0.0–149.0)

## 2011-10-09 LAB — HEPATIC FUNCTION PANEL
ALT: 17 U/L (ref 0–35)
AST: 16 U/L (ref 0–37)
Albumin: 4 g/dL (ref 3.5–5.2)
Total Bilirubin: 0.5 mg/dL (ref 0.3–1.2)

## 2011-10-09 LAB — T4, FREE: Free T4: 0.75 ng/dL (ref 0.60–1.60)

## 2011-10-09 LAB — LDL CHOLESTEROL, DIRECT: Direct LDL: 132 mg/dL

## 2011-10-09 LAB — TSH: TSH: 1.1 u[IU]/mL (ref 0.35–5.50)

## 2011-10-09 MED ORDER — HYDROCHLOROTHIAZIDE 12.5 MG PO TABS: 12.5000 mg | ORAL_TABLET | Freq: Every day | ORAL | Status: DC

## 2011-10-09 MED ORDER — PRAVASTATIN SODIUM 20 MG PO TABS: 20.0000 mg | ORAL_TABLET | Freq: Every day | ORAL | Status: DC

## 2011-10-19 ENCOUNTER — Ambulatory Visit (HOSPITAL_COMMUNITY): Payer: BC Managed Care – PPO | Attending: Internal Medicine | Admitting: Radiology

## 2011-10-19 ENCOUNTER — Encounter (HOSPITAL_COMMUNITY): Payer: BC Managed Care – PPO | Admitting: Radiology

## 2011-10-19 DIAGNOSIS — R5381 Other malaise: Secondary | ICD-10-CM | POA: Insufficient documentation

## 2011-10-19 DIAGNOSIS — E669 Obesity, unspecified: Secondary | ICD-10-CM

## 2011-10-19 DIAGNOSIS — J4489 Other specified chronic obstructive pulmonary disease: Secondary | ICD-10-CM | POA: Insufficient documentation

## 2011-10-19 DIAGNOSIS — J438 Other emphysema: Secondary | ICD-10-CM | POA: Insufficient documentation

## 2011-10-19 DIAGNOSIS — F17200 Nicotine dependence, unspecified, uncomplicated: Secondary | ICD-10-CM | POA: Insufficient documentation

## 2011-10-19 DIAGNOSIS — I10 Essential (primary) hypertension: Secondary | ICD-10-CM | POA: Insufficient documentation

## 2011-10-19 DIAGNOSIS — R0609 Other forms of dyspnea: Secondary | ICD-10-CM | POA: Insufficient documentation

## 2011-10-19 DIAGNOSIS — R0602 Shortness of breath: Secondary | ICD-10-CM

## 2011-10-19 DIAGNOSIS — E663 Overweight: Secondary | ICD-10-CM | POA: Insufficient documentation

## 2011-10-19 DIAGNOSIS — R0989 Other specified symptoms and signs involving the circulatory and respiratory systems: Secondary | ICD-10-CM | POA: Insufficient documentation

## 2011-10-19 DIAGNOSIS — R42 Dizziness and giddiness: Secondary | ICD-10-CM | POA: Insufficient documentation

## 2011-10-19 DIAGNOSIS — R110 Nausea: Secondary | ICD-10-CM | POA: Insufficient documentation

## 2011-10-19 DIAGNOSIS — R000 Tachycardia, unspecified: Secondary | ICD-10-CM | POA: Insufficient documentation

## 2011-10-19 DIAGNOSIS — E785 Hyperlipidemia, unspecified: Secondary | ICD-10-CM | POA: Insufficient documentation

## 2011-10-19 DIAGNOSIS — R0789 Other chest pain: Secondary | ICD-10-CM | POA: Insufficient documentation

## 2011-10-19 DIAGNOSIS — R079 Chest pain, unspecified: Secondary | ICD-10-CM

## 2011-10-19 DIAGNOSIS — J449 Chronic obstructive pulmonary disease, unspecified: Secondary | ICD-10-CM | POA: Insufficient documentation

## 2011-10-19 MED ORDER — TECHNETIUM TC 99M TETROFOSMIN IV KIT
11.0000 | PACK | Freq: Once | INTRAVENOUS | Status: AC | PRN
  Administered 2011-10-19: 11 via INTRAVENOUS

## 2011-10-19 MED ORDER — TECHNETIUM TC 99M TETROFOSMIN IV KIT
33.0000 | PACK | Freq: Once | INTRAVENOUS | Status: AC | PRN
  Administered 2011-10-19: 33 via INTRAVENOUS

## 2011-10-19 NOTE — Progress Notes (Signed)
[facility] SITE 3 NUCLEAR MED [address] [place] [address] [phone]  Cardiology Nuclear Med Study  [name] [name] is a 50 y.o. female [id number] 07-11-1961   Nuclear Med Background Indication for Stress Test:  Evaluation for Ischemia History:  Asthma, COPD,'08 Echo: EF: 60-70%, Emphysema and 07/08 Myocardial Perfusion Study:Normal. EF 68% Cardiac Risk Factors: Hypertension, Lipids, Overweight and Smoker  Symptoms:  Chest Pain, Chest Pressure, Chest Tightness, Dizziness, DOE, Fatigue, Nausea and Rapid HR   Nuclear Pre-Procedure Caffeine/Decaff Intake:  None NPO After: 8:30pm   Lungs: clear IV 0.9% NS with Angio Cath:  20g  IV Site: R Antecubital  IV Started by:  Bonnita Levan, RN  Chest Size (in):  38 Cup Size: D  Height: 5\' 5"  (1.651 m)  Weight:  178 lb (80.74 kg)  BMI:  Body mass index is 29.62 kg/(m^2). Tech Comments:  N/A    Nuclear Med Study 1 or 2 day study: 1 day  Stress Test Type:  Stress  Reading MD: Dietrich Pates, MD  Order Authorizing Provider:  Dr. Rene Paci  Resting Radionuclide: Technetium 13m Tetrofosmin  Resting Radionuclide Dose: 11.0 mCi   Stress Radionuclide:  Technetium 11m Tetrofosmin  Stress Radionuclide Dose: 33.0 mCi           Stress Protocol Rest HR: 62 Stress HR: 155  Rest BP: 110/69 Stress BP: 179/87  Exercise Time (min): 9:30 METS: 10.9   Predicted Max HR: 169 bpm % Max HR: 36.69 bpm Rate Pressure Product: 6820   Dose of Adenosine (mg):  n/a Dose of Lexiscan: n/a mg  Dose of Atropine (mg): n/a Dose of Dobutamine: n/a mcg/kg/min (at max HR)  Stress Test Technologist: Milana Na, EMT-P  Nuclear Technologist:  Domenic Polite, CNMT     Rest Procedure:  Myocardial perfusion imaging was performed at rest 45 minutes following the intravenous administration of Technetium 34m Tetrofosmin. Rest ECG: NSR  Stress Procedure:  The patient exercised for 9:30.  The patient stopped due to fatigue and denied any chest  pain.  There were no significant ST-T wave changes.  Technetium 78m Tetrofosmin was injected at peak exercise and myocardial perfusion imaging was performed after a brief delay. Stress ECG: No significant change from baseline ECG  QPS Raw Data Images:  Normal; no motion artifact; normal heart/lung ratio. Stress Images:  Normal perfusion with minimal apical thinning. Rest Images:  Comparison with the stress images reveals no significant change. Subtraction (SDS):  No evidence of ischemia. Transient Ischemic Dilatation (Normal <1.22):  0.92 Lung/Heart Ratio (Normal <0.45):  0.33  Quantitative Gated Spect Images QGS EDV:  81 ml QGS ESV:  25 ml QGS cine images:  NL LV Function; NL Wall Motion QGS EF: 70%  Impression Exercise Capacity:  Excellent exercise capacity. BP Response:  Normal blood pressure response. Clinical Symptoms:  No chest pain. ECG Impression:  No significant ST segment change suggestive of ischemia. Comparison with Prior Nuclear Study:  No significant change from previous Nuclear study. Overall Impression:  Normal stress nuclear study.  LVEF 70% Dietrich Pates

## 2012-02-08 ENCOUNTER — Ambulatory Visit (INDEPENDENT_AMBULATORY_CARE_PROVIDER_SITE_OTHER): Payer: BC Managed Care – PPO | Admitting: Internal Medicine

## 2012-02-08 ENCOUNTER — Encounter: Payer: Self-pay | Admitting: Internal Medicine

## 2012-02-08 VITALS — BP 110/68 | HR 80 | Temp 98.5°F | Resp 16 | Wt 187.0 lb

## 2012-02-08 DIAGNOSIS — F17200 Nicotine dependence, unspecified, uncomplicated: Secondary | ICD-10-CM

## 2012-02-08 DIAGNOSIS — J438 Other emphysema: Secondary | ICD-10-CM

## 2012-02-08 DIAGNOSIS — J209 Acute bronchitis, unspecified: Secondary | ICD-10-CM

## 2012-02-08 MED ORDER — PROMETHAZINE-DM 6.25-15 MG/5ML PO SYRP: 5.0000 mL | ORAL_SOLUTION | Freq: Four times a day (QID) | ORAL | Status: AC | PRN

## 2012-02-08 MED ORDER — CEFUROXIME AXETIL 500 MG PO TABS: 500.0000 mg | ORAL_TABLET | Freq: Two times a day (BID) | ORAL | Status: AC

## 2012-02-08 NOTE — Assessment & Plan Note (Signed)
She agrees to try to quit smoking and wants to try e-cigs to achieve that goal

## 2012-02-08 NOTE — Assessment & Plan Note (Signed)
Start ceftin for the infection and a cough suppressant 

## 2012-02-08 NOTE — Patient Instructions (Signed)

## 2012-02-08 NOTE — Progress Notes (Signed)
  Subjective:    Patient ID: [name] [name], female    DOB: 07-Oct-1961, 51 y.o.   MRN: [id number]  Cough This is a new problem. The current episode started in the past 7 days. The problem has been gradually worsening. The problem occurs every few hours. The cough is productive of purulent sputum. Associated symptoms include a sore throat. Pertinent negatives include no chest pain, chills, ear congestion, ear pain, fever, headaches, heartburn, hemoptysis, myalgias, nasal congestion, postnasal drip, rash, rhinorrhea, shortness of breath, sweats, weight loss or wheezing. The symptoms are aggravated by nothing. She has tried nothing for the symptoms. Her past medical history is significant for COPD and emphysema.      Review of Systems  Constitutional: Negative for fever, chills, weight loss, diaphoresis, activity change, appetite change, fatigue and unexpected weight change.  HENT: Positive for sore throat. Negative for ear pain, facial swelling, rhinorrhea, sneezing, drooling, mouth sores, trouble swallowing, neck pain, neck stiffness, dental problem, voice change, postnasal drip and sinus pressure.   Eyes: Negative.   Respiratory: Positive for cough. Negative for apnea, hemoptysis, choking, chest tightness, shortness of breath, wheezing and stridor.   Cardiovascular: Negative for chest pain, palpitations and leg swelling.  Gastrointestinal: Negative.  Negative for heartburn.  Genitourinary: Negative.   Musculoskeletal: Negative for myalgias, back pain, joint swelling, arthralgias and gait problem.  Skin: Negative for color change, pallor and rash.  Neurological: Negative for dizziness, tremors, seizures, syncope, facial asymmetry, speech difficulty, weakness, light-headedness, numbness and headaches.  Hematological: Negative for adenopathy. Does not bruise/bleed easily.  Psychiatric/Behavioral: Negative.        Objective:   Physical Exam  Vitals reviewed. Constitutional: She is oriented to  person, place, and time. She appears well-developed and well-nourished. No distress.  HENT:  Head: Normocephalic and atraumatic.  Mouth/Throat: Oropharynx is clear and moist. No oropharyngeal exudate.  Eyes: Conjunctivae are normal. Right eye exhibits no discharge. Left eye exhibits no discharge. No scleral icterus.  Neck: Normal range of motion. Neck supple. No JVD present. No tracheal deviation present. No thyromegaly present.  Cardiovascular: Normal rate, regular rhythm, normal heart sounds and intact distal pulses.  Exam reveals no gallop and no friction rub.   No murmur heard. Pulmonary/Chest: Effort normal and breath sounds normal. No stridor. No respiratory distress. She has no wheezes. She has no rales. She exhibits no tenderness.  Abdominal: Soft. Bowel sounds are normal. She exhibits no distension and no mass. There is no tenderness. There is no rebound and no guarding.  Musculoskeletal: Normal range of motion. She exhibits no edema and no tenderness.  Lymphadenopathy:    She has no cervical adenopathy.  Neurological: She is oriented to person, place, and time.  Skin: Skin is warm and dry. No rash noted. She is not diaphoretic. No erythema. No pallor.  Psychiatric: She has a normal mood and affect. Her behavior is normal. Judgment and thought content normal.          Assessment & Plan:

## 2012-09-17 ENCOUNTER — Other Ambulatory Visit: Payer: Self-pay | Admitting: *Deleted

## 2012-09-17 MED ORDER — HYDROCHLOROTHIAZIDE 12.5 MG PO TABS: 12.5000 mg | ORAL_TABLET | Freq: Every day | ORAL | Status: DC

## 2012-09-17 MED ORDER — PRAVASTATIN SODIUM 20 MG PO TABS: 20.0000 mg | ORAL_TABLET | Freq: Every day | ORAL | Status: DC

## 2012-09-17 NOTE — Telephone Encounter (Signed)
R'cd fax from [facility] for refill of Pravastatin and HCTZ.

## 2012-10-02 ENCOUNTER — Other Ambulatory Visit: Payer: Self-pay | Admitting: Internal Medicine

## 2012-10-09 ENCOUNTER — Other Ambulatory Visit: Payer: Self-pay | Admitting: *Deleted

## 2012-10-09 MED ORDER — LOSARTAN POTASSIUM 25 MG PO TABS: 25.0000 mg | ORAL_TABLET | Freq: Every day | ORAL | Status: DC

## 2012-11-01 ENCOUNTER — Other Ambulatory Visit: Payer: Self-pay | Admitting: Internal Medicine

## 2012-11-11 LAB — HM MAMMOGRAPHY

## 2013-01-05 ENCOUNTER — Other Ambulatory Visit: Payer: Self-pay | Admitting: Internal Medicine

## 2013-01-15 ENCOUNTER — Other Ambulatory Visit: Payer: Self-pay | Admitting: Internal Medicine

## 2013-01-15 ENCOUNTER — Other Ambulatory Visit: Payer: Self-pay | Admitting: *Deleted

## 2013-02-03 ENCOUNTER — Other Ambulatory Visit: Payer: Self-pay | Admitting: Internal Medicine

## 2013-02-05 ENCOUNTER — Telehealth: Payer: Self-pay | Admitting: Internal Medicine

## 2013-02-05 MED ORDER — PRAVASTATIN SODIUM 20 MG PO TABS: 20.0000 mg | ORAL_TABLET | Freq: Every day | ORAL | Status: DC

## 2013-02-05 MED ORDER — HYDROCHLOROTHIAZIDE 12.5 MG PO TABS: 12.5000 mg | ORAL_TABLET | Freq: Every day | ORAL | Status: DC

## 2013-02-05 NOTE — Telephone Encounter (Signed)
Notified pt 30 days sent to local pharmacy...[name] [name]

## 2013-02-05 NOTE — Telephone Encounter (Signed)
The pt is hoping to get a refill of her Pravastatin 20mg  and hydrocholorothiazide 12.5mg  refilled until her apt in mid-march.

## 2013-02-12 ENCOUNTER — Other Ambulatory Visit: Payer: Self-pay | Admitting: *Deleted

## 2013-02-12 MED ORDER — PRAVASTATIN SODIUM 20 MG PO TABS: 20.0000 mg | ORAL_TABLET | Freq: Every day | ORAL | Status: DC

## 2013-02-18 ENCOUNTER — Ambulatory Visit: Payer: BC Managed Care – PPO | Admitting: Internal Medicine

## 2013-02-18 DIAGNOSIS — Z0289 Encounter for other administrative examinations: Secondary | ICD-10-CM

## 2013-03-03 ENCOUNTER — Encounter: Payer: Self-pay | Admitting: Internal Medicine

## 2013-03-03 ENCOUNTER — Other Ambulatory Visit (INDEPENDENT_AMBULATORY_CARE_PROVIDER_SITE_OTHER): Payer: BC Managed Care – PPO

## 2013-03-03 ENCOUNTER — Ambulatory Visit (INDEPENDENT_AMBULATORY_CARE_PROVIDER_SITE_OTHER): Payer: BC Managed Care – PPO | Admitting: Internal Medicine

## 2013-03-03 VITALS — BP 140/82 | HR 71 | Temp 97.5°F | Wt 181.8 lb

## 2013-03-03 DIAGNOSIS — Z0000 Encounter for general adult medical examination without abnormal findings: Secondary | ICD-10-CM

## 2013-03-03 DIAGNOSIS — Z23 Encounter for immunization: Secondary | ICD-10-CM

## 2013-03-03 DIAGNOSIS — F17200 Nicotine dependence, unspecified, uncomplicated: Secondary | ICD-10-CM

## 2013-03-03 DIAGNOSIS — Z1211 Encounter for screening for malignant neoplasm of colon: Secondary | ICD-10-CM

## 2013-03-03 DIAGNOSIS — E785 Hyperlipidemia, unspecified: Secondary | ICD-10-CM

## 2013-03-03 DIAGNOSIS — I10 Essential (primary) hypertension: Secondary | ICD-10-CM

## 2013-03-03 LAB — URINALYSIS, ROUTINE W REFLEX MICROSCOPIC
Bilirubin Urine: NEGATIVE
Nitrite: NEGATIVE
Specific Gravity, Urine: 1.02 (ref 1.000–1.030)
pH: 5.5 (ref 5.0–8.0)

## 2013-03-03 LAB — HEPATIC FUNCTION PANEL
ALT: 19 U/L (ref 0–35)
AST: 20 U/L (ref 0–37)
Bilirubin, Direct: 0.1 mg/dL (ref 0.0–0.3)
Total Bilirubin: 0.5 mg/dL (ref 0.3–1.2)

## 2013-03-03 LAB — CBC WITH DIFFERENTIAL/PLATELET
Basophils Absolute: 0 10*3/uL (ref 0.0–0.1)
Lymphocytes Relative: 35 % (ref 12.0–46.0)
Monocytes Relative: 7 % (ref 3.0–12.0)
Neutrophils Relative %: 51.5 % (ref 43.0–77.0)
Platelets: 225 10*3/uL (ref 150.0–400.0)
RDW: 13.6 % (ref 11.5–14.6)

## 2013-03-03 LAB — BASIC METABOLIC PANEL
BUN: 13 mg/dL (ref 6–23)
Calcium: 9 mg/dL (ref 8.4–10.5)
GFR: 101.54 mL/min (ref >60.00)
Glucose, Bld: 104 mg/dL — ABNORMAL HIGH (ref 70–99)
Sodium: 139 mEq/L (ref 135–145)

## 2013-03-03 LAB — LIPID PANEL
Cholesterol: 152 mg/dL (ref 0–200)
LDL Cholesterol: 87 mg/dL (ref 0–99)
Total CHOL/HDL Ratio: 4
Triglycerides: 119 mg/dL (ref 0.0–149.0)
VLDL: 23.8 mg/dL (ref 0.0–40.0)

## 2013-03-03 MED ORDER — PRAVASTATIN SODIUM 20 MG PO TABS: 20.0000 mg | ORAL_TABLET | Freq: Every day | ORAL | Status: DC

## 2013-03-03 MED ORDER — LOSARTAN POTASSIUM 25 MG PO TABS: 25.0000 mg | ORAL_TABLET | Freq: Every day | ORAL | Status: DC

## 2013-03-03 MED ORDER — HYDROCHLOROTHIAZIDE 12.5 MG PO TABS: 12.5000 mg | ORAL_TABLET | Freq: Every day | ORAL | Status: DC

## 2013-03-03 NOTE — Patient Instructions (Signed)
It was good to see you today. Health Maintenance reviewed - tetanus boster updated today, remember to get her flu shot each fall -all other recommended immunizations and age-appropriate screenings are up-to-date. Test(s) ordered today. Your results will be released to MyChart (or called to you) after review, usually within 72hours after test completion. If any changes need to be made, you will be notified at that same time. Medications reviewed and updated, no changes at this time. Refill on medication(s) as discussed today. we'll make referral for colonoscopy screening. Our office will contact you regarding appointment(s) once made. Continue to think about giving up cigarettes! Use nicotine gum, nicotine patches or electronic cigarettes. If you're interested in medication to help you quit, please call  - let me know how I can help! Please schedule followup annually for physical and labs, call sooner if problems. Health Maintenance, Females A healthy lifestyle and preventative care can promote health and wellness.  Maintain regular health, dental, and eye exams.  Eat a healthy diet. Foods like vegetables, fruits, whole grains, low-fat dairy products, and lean protein foods contain the nutrients you need without too many calories. Decrease your intake of foods high in solid fats, added sugars, and salt. Get information about a proper diet from your caregiver, if necessary.  Regular physical exercise is one of the most important things you can do for your health. Most adults should get at least 150 minutes of moderate-intensity exercise (any activity that increases your heart rate and causes you to sweat) each week. In addition, most adults need muscle-strengthening exercises on 2 or more days a week.   Maintain a healthy weight. The body mass index (BMI) is a screening tool to identify possible weight problems. It provides an estimate of body fat based on height and weight. Your caregiver can help  determine your BMI, and can help you achieve or maintain a healthy weight. For adults 20 years and older:  A BMI below 18.5 is considered underweight.  A BMI of 18.5 to 24.9 is normal.  A BMI of 25 to 29.9 is considered overweight.  A BMI of 30 and above is considered obese.  Maintain normal blood lipids and cholesterol by exercising and minimizing your intake of saturated fat. Eat a balanced diet with plenty of fruits and vegetables. Blood tests for lipids and cholesterol should begin at age 70 and be repeated every 5 years. If your lipid or cholesterol levels are high, you are over 50, or you are a high risk for heart disease, you may need your cholesterol levels checked more frequently.Ongoing high lipid and cholesterol levels should be treated with medicines if diet and exercise are not effective.  If you smoke, find out from your caregiver how to quit. If you do not use tobacco, do not start.  If you are pregnant, do not drink alcohol. If you are breastfeeding, be very cautious about drinking alcohol. If you are not pregnant and choose to drink alcohol, do not exceed 1 drink per day. One drink is considered to be 12 ounces (355 mL) of beer, 5 ounces (148 mL) of wine, or 1.5 ounces (44 mL) of liquor.  Avoid use of street drugs. Do not share needles with anyone. Ask for help if you need support or instructions about stopping the use of drugs.  High blood pressure causes heart disease and increases the risk of stroke. Blood pressure should be checked at least every 1 to 2 years. Ongoing high blood pressure should be treated with  medicines, if weight loss and exercise are not effective.  If you are 67 to 53 years old, ask your caregiver if you should take aspirin to prevent strokes.  Diabetes screening involves taking a blood sample to check your fasting blood sugar level. This should be done once every 3 years, after age 28, if you are within normal weight and without risk factors for  diabetes. Testing should be considered at a younger age or be carried out more frequently if you are overweight and have at least 1 risk factor for diabetes.  Breast cancer screening is essential preventative care for women. You should practice "breast self-awareness." This means understanding the normal appearance and feel of your breasts and may include breast self-examination. Any changes detected, no matter how small, should be reported to a caregiver. Women in their 68s and 30s should have a clinical breast exam (CBE) by a caregiver as part of a regular health exam every 1 to 3 years. After age 54, women should have a CBE every year. Starting at age 73, women should consider having a mammogram (breast X-ray) every year. Women who have a family history of breast cancer should talk to their caregiver about genetic screening. Women at a high risk of breast cancer should talk to their caregiver about having an MRI and a mammogram every year.  The Pap test is a screening test for cervical cancer. Women should have a Pap test starting at age 94. Between ages 54 and 48, Pap tests should be repeated every 2 years. Beginning at age 57, you should have a Pap test every 3 years as long as the past 3 Pap tests have been normal. If you had a hysterectomy for a problem that was not cancer or a condition that could lead to cancer, then you no longer need Pap tests. If you are between ages 1 and 65, and you have had normal Pap tests going back 10 years, you no longer need Pap tests. If you have had past treatment for cervical cancer or a condition that could lead to cancer, you need Pap tests and screening for cancer for at least 20 years after your treatment. If Pap tests have been discontinued, risk factors (such as a new sexual partner) need to be reassessed to determine if screening should be resumed. Some women have medical problems that increase the chance of getting cervical cancer. In these cases, your caregiver  may recommend more frequent screening and Pap tests.  The human papillomavirus (HPV) test is an additional test that may be used for cervical cancer screening. The HPV test looks for the virus that can cause the cell changes on the cervix. The cells collected during the Pap test can be tested for HPV. The HPV test could be used to screen women aged 77 years and older, and should be used in women of any age who have unclear Pap test results. After the age of 63, women should have HPV testing at the same frequency as a Pap test.  Colorectal cancer can be detected and often prevented. Most routine colorectal cancer screening begins at the age of 29 and continues through age 69. However, your caregiver may recommend screening at an earlier age if you have risk factors for colon cancer. On a yearly basis, your caregiver may provide home test kits to check for hidden blood in the stool. Use of a small camera at the end of a tube, to directly examine the colon (sigmoidoscopy or colonoscopy), can  detect the earliest forms of colorectal cancer. Talk to your caregiver about this at age 69, when routine screening begins. Direct examination of the colon should be repeated every 5 to 10 years through age 35, unless early forms of pre-cancerous polyps or small growths are found.  Hepatitis C blood testing is recommended for all people born from [birth year] through 1966 and any individual with known risks for hepatitis C.  Practice safe sex. Use condoms and avoid high-risk sexual practices to reduce the spread of sexually transmitted infections (STIs). Sexually active women aged 51 and younger should be checked for Chlamydia, which is a common sexually transmitted infection. Older women with new or multiple partners should also be tested for Chlamydia. Testing for other STIs is recommended if you are sexually active and at increased risk.  Osteoporosis is a disease in which the bones lose minerals and strength with aging. This  can result in serious bone fractures. The risk of osteoporosis can be identified using a bone density scan. Women ages 77 and over and women at risk for fractures or osteoporosis should discuss screening with their caregivers. Ask your caregiver whether you should be taking a calcium supplement or vitamin D to reduce the rate of osteoporosis.  Menopause can be associated with physical symptoms and risks. Hormone replacement therapy is available to decrease symptoms and risks. You should talk to your caregiver about whether hormone replacement therapy is right for you.  Use sunscreen with a sun protection factor (SPF) of 30 or greater. Apply sunscreen liberally and repeatedly throughout the day. You should seek shade when your shadow is shorter than you. Protect yourself by wearing long sleeves, pants, a wide-brimmed hat, and sunglasses year round, whenever you are outdoors.  Notify your caregiver of new moles or changes in moles, especially if there is a change in shape or color. Also notify your caregiver if a mole is larger than the size of a pencil eraser.  Stay current with your immunizations. Document Released: 06/12/2011 Document Revised: 02/19/2012 Document Reviewed: 06/12/2011 [facility] Patient Information 2013 [place], [place]. You Can Quit Smoking If you are ready to quit smoking or are thinking about it, congratulations! You have chosen to help yourself be healthier and live longer! There are lots of different ways to quit smoking. Nicotine gum, nicotine patches, a nicotine inhaler, or nicotine nasal spray can help with physical craving. Hypnosis, support groups, and medicines help break the habit of smoking. TIPS TO GET OFF AND STAY OFF CIGARETTES  Learn to predict your moods. Do not let a bad situation be your excuse to have a cigarette. Some situations in your life might tempt you to have a cigarette.  Ask friends and co-workers not to smoke around you.  Make your home  smoke-free.  Never have "just one" cigarette. It leads to wanting another and another. Remind yourself of your decision to quit.  On a card, make a list of your reasons for not smoking. Read it at least the same number of times a day as you have a cigarette. Tell yourself everyday, "I do not want to smoke. I choose not to smoke."  Ask someone at home or work to help you with your plan to quit smoking.  Have something planned after you eat or have a cup of coffee. Take a walk or get other exercise to perk you up. This will help to keep you from overeating.  Try a relaxation exercise to calm you down and decrease your stress. Remember, you  may be tense and nervous the first two weeks after you quit. This will pass.  Find new activities to keep your hands busy. Play with a pen, coin, or rubber band. Doodle or draw things on paper.  Brush your teeth right after eating. This will help cut down the craving for the taste of tobacco after meals. You can try mouthwash too.  Try gum, breath mints, or diet candy to keep something in your mouth. IF YOU SMOKE AND WANT TO QUIT:  Do not stock up on cigarettes. Never buy a carton. Wait until one pack is finished before you buy another.  Never carry cigarettes with you at work or at home.  Keep cigarettes as far away from you as possible. Leave them with someone else.  Never carry matches or a lighter with you.  Ask yourself, "Do I need this cigarette or is this just a reflex?"  Bet with someone that you can quit. Put cigarette money in a piggy bank every morning. If you smoke, you give up the money. If you do not smoke, by the end of the week, you keep the money.  Keep trying. It takes 21 days to change a habit!  Talk to your doctor about using medicines to help you quit. These include nicotine replacement gum, lozenges, or skin patches. Document Released: 09/23/2009 Document Revised: 02/19/2012 Document Reviewed: 09/23/2009 [facility] Patient  Information 2013 [place], [place].

## 2013-03-03 NOTE — Assessment & Plan Note (Signed)
Recent elevation related stress and anxiety symptoms + variable med compliance resume losartan + HCTZ for BP control  BP Readings from Last 3 Encounters:  03/03/13 140/82  02/08/12 110/68  10/02/11 130/90

## 2013-03-03 NOTE — Assessment & Plan Note (Signed)
5 minutes today spent counseling patient on unhealthy effects of continued tobacco abuse and encouragement of cessation including medical options available to help the patient quit smoking. 

## 2013-03-03 NOTE — Progress Notes (Signed)
Subjective:    Patient ID: [name] [name], female    DOB: 27-Jul-1961, 52 y.o.   MRN: [id number]  HPI patient is here today for annual physical. Patient feels well overall.  Also reviewed chronic medical issues:  dyslipidemia -begun on statin 11/2009 - reports compliance with ongoing medical treatment and no changes in medication dose or frequency. denies adverse side effects related to current therapy.   HTN - recent increase in BP readings x 2 weeks with stress (furniture market) -reports compliance with ongoing medical treatment and no changes in medication dose or frequency. denies adverse side effects related to current therapy.  Past Medical History  Diagnosis Date  . ANXIETY   . ARTHRITIS   . ASTHMA   . DEGENERATIVE JOINT DISEASE   . EMPHYSEMA, MILD   . GALLBLADDER DISEASE, HX OF   . HYPERLIPIDEMIA   . HYPERTENSION   . TOBACCO ABUSE   . Vitamin D deficiency    Family History  Problem Relation Age of Onset  . Alcohol abuse Other   . Arthritis Other   . Lung cancer Other   . Hypertension Other   . Hyperlipidemia Other    History  Substance Use Topics  . Smoking status: Current Every Day Smoker  . 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 Alcohol Use: No    Review of Systems  Respiratory: Negative for cough and shortness of breath.   Cardiovascular: Negative for palpitations or syncope - Neurological: Negative for dizziness or headache.  No other specific complaints in a complete review of systems (except as listed in HPI above).      Objective:   Physical Exam  BP 140/82  Pulse 71  Temp(Src) 97.5 F (36.4 C) (Oral)  Wt 181 lb 12.8 oz (82.464 kg)  BMI 30.25 kg/m2  SpO2 96% Wt Readings from Last 3 Encounters:  03/03/13 181 lb 12.8 oz (82.464 kg)  02/08/12 187 lb (84.823 kg)  10/19/11 178 lb (80.74 kg)   Constitutional: She is overweight. She appears  well-developed and well-nourished. No distress.  HENT: Head: Normocephalic and atraumatic. Ears: B TMs ok, no erythema or effusion; Nose: Nose normal. Mouth/Throat: Oropharynx is clear and moist. No oropharyngeal exudate.  Eyes: Conjunctivae and EOM are normal. Pupils are equal, round, and reactive to light. No scleral icterus.  Neck: Normal range of motion. Neck supple. No JVD present. No thyromegaly present.  Cardiovascular: Normal rate, regular rhythm and normal heart sounds.  No murmur heard. No BLE edema. Pulmonary/Chest: Effort normal and breath sounds normal. No respiratory distress. She has no wheezes.  Abdominal: Soft. Bowel sounds are normal. She exhibits no distension. There is no tenderness. no masses Musculoskeletal: Normal range of motion, no joint effusions. No gross deformities Neurological: She is alert and oriented to person, place, and time. No cranial nerve deficit. Coordination normal.  Skin: Skin is warm and dry. No rash noted. No erythema.   Psychiatric: She has a mildly anxious mood and affect. Her behavior is normal. Judgment and thought content normal.   Lab Results  Component Value Date   WBC 5.9 04/24/2010   HGB 13.2 04/24/2010   HCT 38.6 04/24/2010   PLT 191 04/24/2010   GLUCOSE 94 10/19/2010   CHOL 200 10/09/2011   TRIG 243.0* 10/09/2011   HDL 41.80 10/09/2011   LDLDIRECT 132.0 10/09/2011   LDLCALC 40 10/13/2009   LDLCALC  188 10/13/2009   ALT 17 10/09/2011   AST 16 10/09/2011   NA 141 10/19/2010   K 4.1 10/19/2010   CL 102 10/19/2010   CREATININE 0.6 10/19/2010   BUN 12 10/19/2010   CO2 33* 10/19/2010   TSH 1.10 10/09/2011   INR 1.0 07/01/2007   HGBA1C 5.7 10/09/2011       Assessment & Plan:  CPX/v70.0 - Patient has been counseled on age-appropriate routine health concerns for screening and prevention. These are reviewed and up-to-date. Immunizations are up-to-date or declined. Labs ordered and reviewed.  Also see problem list. Medications and labs reviewed  today.

## 2013-03-03 NOTE — Assessment & Plan Note (Signed)
Started statin 10/2009 - prava Recheck now with LFTs 

## 2013-06-11 ENCOUNTER — Other Ambulatory Visit: Payer: Self-pay | Admitting: *Deleted

## 2013-06-11 MED ORDER — LOSARTAN POTASSIUM 25 MG PO TABS: 25.0000 mg | ORAL_TABLET | Freq: Every day | ORAL | Status: DC

## 2013-06-11 MED ORDER — HYDROCHLOROTHIAZIDE 12.5 MG PO TABS: 12.5000 mg | ORAL_TABLET | Freq: Every day | ORAL | Status: DC

## 2013-06-11 MED ORDER — PRAVASTATIN SODIUM 20 MG PO TABS: 20.0000 mg | ORAL_TABLET | Freq: Every day | ORAL | Status: DC

## 2013-06-11 NOTE — Telephone Encounter (Signed)
Pt called requesting refills for 90 supply on HCTZ, Losartin, Pravastatin be sent to [facility] due to insurance change.  Refills complete, pt notified,

## 2013-12-10 ENCOUNTER — Other Ambulatory Visit: Payer: Self-pay | Admitting: Internal Medicine

## 2014-03-09 ENCOUNTER — Other Ambulatory Visit: Payer: Self-pay | Admitting: Internal Medicine

## 2014-03-11 LAB — HM PAP SMEAR

## 2014-03-11 LAB — HM MAMMOGRAPHY

## 2014-06-23 ENCOUNTER — Encounter: Payer: Self-pay | Admitting: Internal Medicine

## 2014-06-23 ENCOUNTER — Ambulatory Visit (INDEPENDENT_AMBULATORY_CARE_PROVIDER_SITE_OTHER): Payer: BC Managed Care – PPO | Admitting: Internal Medicine

## 2014-06-23 VITALS — BP 120/68 | HR 75 | Temp 98.3°F | Ht 65.0 in | Wt 188.1 lb

## 2014-06-23 DIAGNOSIS — Z0000 Encounter for general adult medical examination without abnormal findings: Secondary | ICD-10-CM

## 2014-06-23 DIAGNOSIS — Z1211 Encounter for screening for malignant neoplasm of colon: Secondary | ICD-10-CM

## 2014-06-23 DIAGNOSIS — E559 Vitamin D deficiency, unspecified: Secondary | ICD-10-CM | POA: Insufficient documentation

## 2014-06-23 DIAGNOSIS — F17200 Nicotine dependence, unspecified, uncomplicated: Secondary | ICD-10-CM

## 2014-06-23 DIAGNOSIS — E785 Hyperlipidemia, unspecified: Secondary | ICD-10-CM

## 2014-06-23 DIAGNOSIS — I10 Essential (primary) hypertension: Secondary | ICD-10-CM

## 2014-06-23 MED ORDER — HYDROCHLOROTHIAZIDE 12.5 MG PO TABS: ORAL_TABLET | ORAL | Status: DC

## 2014-06-23 MED ORDER — PRAVASTATIN SODIUM 20 MG PO TABS: ORAL_TABLET | ORAL | Status: DC

## 2014-06-23 MED ORDER — LOSARTAN POTASSIUM 25 MG PO TABS: ORAL_TABLET | ORAL | Status: DC

## 2014-06-23 NOTE — Assessment & Plan Note (Signed)
5 minutes today spent counseling patient on unhealthy effects of continued tobacco abuse and encouragement of cessation including medical options available to help the patient quit smoking. Information given on chantix

## 2014-06-23 NOTE — Assessment & Plan Note (Signed)
Started statin 10/2009 - prava Recheck now with LFTs

## 2014-06-23 NOTE — Progress Notes (Signed)
Pre visit review using our clinic review tool, if applicable. No additional management support is needed unless otherwise documented below in the visit note. 

## 2014-06-23 NOTE — Progress Notes (Signed)
Subjective:    Patient ID: [name] [name], female    DOB: 03-19-1961, 53 y.o.   MRN: [id number]  HPI  patient is here today for annual physical. Patient feels well and has no complaints.   Also reviewed chronic medical issues and interval medical events  Past Medical History  Diagnosis Date  . ANXIETY   . ARTHRITIS   . ASTHMA   . DEGENERATIVE JOINT DISEASE   . EMPHYSEMA, MILD   . GALLBLADDER DISEASE, HX OF   . HYPERLIPIDEMIA   . HYPERTENSION   . TOBACCO ABUSE   . Vitamin D deficiency    Family History  Problem Relation Age of Onset  . Alcohol abuse Father 47    sober since age 7  . Arthritis Father   . Lung cancer Paternal Grandmother   . Hypertension Mother   . Hyperlipidemia Mother   . Hypertension Father   . Hyperlipidemia Father   . Rectal cancer Maternal Grandmother    History  Substance Use Topics  . Smoking status: Current Every Day Smoker  . 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 Alcohol Use: No    Review of Systems  Constitutional: Negative for fatigue and unexpected weight change.  Respiratory: Negative for cough, shortness of breath and wheezing.   Cardiovascular: Negative for chest pain, palpitations and leg swelling.  Gastrointestinal: Negative for nausea, abdominal pain and diarrhea.  Neurological: Negative for dizziness, weakness, light-headedness and headaches.  Psychiatric/Behavioral: Negative for dysphoric mood. The patient is not nervous/anxious.   All other systems reviewed and are negative.      Objective:   Physical Exam  BP 120/68  Pulse 75  Temp(Src) 98.3 F (36.8 C) (Oral)  Ht 5\' 5"  (1.651 m)  Wt 188 lb 2 oz (85.333 kg)  BMI 31.31 kg/m2  SpO2 96% Wt Readings from Last 3 Encounters:  06/23/14 188 lb 2 oz (85.333 kg)  03/03/13 181 lb 12.8 oz (82.464 kg)  02/08/12 187 lb (84.823 kg)   Constitutional: She appears well-developed  and well-nourished. No distress.  HENT: Head: Normocephalic and atraumatic. Ears: B TMs ok, no erythema or effusion; Nose: Nose normal. Mouth/Throat: Oropharynx is clear and moist. No oropharyngeal exudate.  Eyes: Conjunctivae and EOM are normal. Pupils are equal, round, and reactive to light. No scleral icterus.  Neck: Normal range of motion. Neck supple. No JVD present. No thyromegaly present.  Cardiovascular: Normal rate, regular rhythm and normal heart sounds.  No murmur heard. No BLE edema. Pulmonary/Chest: Effort normal and breath sounds normal. No respiratory distress. She has no wheezes.  Abdominal: Soft. Bowel sounds are normal. She exhibits no distension. There is no tenderness. no masses Musculoskeletal:  Feet with mild bunions and calluses. No open ulceration or sores. Normal range of motion, no joint effusions. No gross deformities Neurological: She is alert and oriented to person, place, and time. No cranial nerve deficit. Coordination, balance, strength, speech and gait are normal.  Skin: tan with solar changes. Skin is warm and dry. No rash noted. No erythema.  Psychiatric: She has a normal mood and affect. Her behavior is normal. Judgment and thought content normal.     Lab Results  Component Value Date   WBC 5.7 03/03/2013   HGB 13.2 03/03/2013   HCT 38.3 03/03/2013   PLT 225.0 03/03/2013   GLUCOSE 104* 03/03/2013   CHOL 152 03/03/2013  TRIG 119.0 03/03/2013   HDL 40.90 03/03/2013   LDLDIRECT 132.0 10/09/2011   LDLCALC 87 03/03/2013   ALT 19 03/03/2013   AST 20 03/03/2013   NA 139 03/03/2013   K 3.4* 03/03/2013   CL 102 03/03/2013   CREATININE 0.7 03/03/2013   BUN 13 03/03/2013   CO2 31 03/03/2013   TSH 0.59 03/03/2013   INR 1.0 07/01/2007   HGBA1C 5.7 10/09/2011    No results found.     Assessment & Plan:   CPX/v70.0 - Patient has been counseled on age-appropriate routine health concerns for screening and prevention. These are reviewed and up-to-date. Immunizations are  up-to-date or declined. Labs ordered and reviewed. Refer for colo  Problem List Items Addressed This Visit   HYPERLIPIDEMIA     Started statin 10/2009 - prava Recheck now with LFTs    Relevant Medications      hydrochlorothiazide (HYDRODIURIL) tablet      losartan (COZAAR) tablet      pravastatin (PRAVACHOL) tablet   HYPERTENSION      Hx related stress and anxiety symptoms + variable med compliance resumed losartan + HCTZ for BP control 02/2013 - doing well The current medical regimen is effective;  continue present plan and medications.  BP Readings from Last 3 Encounters:  06/23/14 120/68  03/03/13 140/82  02/08/12 110/68      Relevant Medications      hydrochlorothiazide (HYDRODIURIL) tablet      losartan (COZAAR) tablet      pravastatin (PRAVACHOL) tablet   TOBACCO ABUSE     5 minutes today spent counseling patient on unhealthy effects of continued tobacco abuse and encouragement of cessation including medical options available to help the patient quit smoking. Information given on chantix    Unspecified vitamin D deficiency   Relevant Orders      Vit D  25 hydroxy (rtn osteoporosis monitoring)    Other Visit Diagnoses   Routine general medical examination at a health care facility    -  Primary    Relevant Orders       Basic metabolic panel       CBC with Differential       Hepatic function panel       Lipid panel       TSH       Urinalysis, Routine w reflex microscopic    Special screening for malignant neoplasms, colon        Relevant Orders       Ambulatory referral to Gastroenterology

## 2014-06-23 NOTE — Assessment & Plan Note (Signed)
Hx related stress and anxiety symptoms + variable med compliance resumed losartan + HCTZ for BP control 02/2013 - doing well The current medical regimen is effective;  continue present plan and medications.  BP Readings from Last 3 Encounters:  06/23/14 120/68  03/03/13 140/82  02/08/12 110/68

## 2014-06-23 NOTE — Patient Instructions (Addendum)
It was good to see you today.  We have reviewed your prior records including labs and tests today  Health Maintenance reviewed - refer for colonoscopy - all other recommended immunizations and age-appropriate screenings are up-to-date.  Test(s) ordered today. Your results will be released to MyChart (or called to you) after review, usually within 72hours after test completion. If any changes need to be made, you will be notified at that same time.  Medications reviewed and updated, no changes recommended at this time. Refill on medication(s) as discussed today.  Please schedule followup in 12 months for annual exam and labs, call sooner if problems.  Health Maintenance, Female A healthy lifestyle and preventative care can promote health and wellness.  Maintain regular health, dental, and eye exams.  Eat a healthy diet. Foods like vegetables, fruits, whole grains, low-fat dairy products, and lean protein foods contain the nutrients you need without too many calories. Decrease your intake of foods high in solid fats, added sugars, and salt. Get information about a proper diet from your caregiver, if necessary.  Regular physical exercise is one of the most important things you can do for your health. Most adults should get at least 150 minutes of moderate-intensity exercise (any activity that increases your heart rate and causes you to sweat) each week. In addition, most adults need muscle-strengthening exercises on 2 or more days a week.   Maintain a healthy weight. The body mass index (BMI) is a screening tool to identify possible weight problems. It provides an estimate of body fat based on height and weight. Your caregiver can help determine your BMI, and can help you achieve or maintain a healthy weight. For adults 20 years and older:  A BMI below 18.5 is considered underweight.  A BMI of 18.5 to 24.9 is normal.  A BMI of 25 to 29.9 is considered overweight.  A BMI of 30 and above is  considered obese.  Maintain normal blood lipids and cholesterol by exercising and minimizing your intake of saturated fat. Eat a balanced diet with plenty of fruits and vegetables. Blood tests for lipids and cholesterol should begin at age 5 and be repeated every 5 years. If your lipid or cholesterol levels are high, you are over 50, or you are a high risk for heart disease, you may need your cholesterol levels checked more frequently.Ongoing high lipid and cholesterol levels should be treated with medicines if diet and exercise are not effective.  If you smoke, find out from your caregiver how to quit. If you do not use tobacco, do not start.  Lung cancer screening is recommended for adults aged 55-80 years who are at high risk for developing lung cancer because of a history of smoking. Yearly low-dose computed tomography (CT) is recommended for people who have at least a 30-pack-year history of smoking and are a current smoker or have quit within the past 15 years. A pack year of smoking is smoking an average of 1 pack of cigarettes a day for 1 year (for example: 1 pack a day for 30 years or 2 packs a day for 15 years). Yearly screening should continue until the smoker has stopped smoking for at least 15 years. Yearly screening should also be stopped for people who develop a health problem that would prevent them from having lung cancer treatment.  If you are pregnant, do not drink alcohol. If you are breastfeeding, be very cautious about drinking alcohol. If you are not pregnant and choose to drink  alcohol, do not exceed 1 drink per day. One drink is considered to be 12 ounces (355 mL) of beer, 5 ounces (148 mL) of wine, or 1.5 ounces (44 mL) of liquor.  Avoid use of street drugs. Do not share needles with anyone. Ask for help if you need support or instructions about stopping the use of drugs.  High blood pressure causes heart disease and increases the risk of stroke. Blood pressure should be  checked at least every 1 to 2 years. Ongoing high blood pressure should be treated with medicines, if weight loss and exercise are not effective.  If you are 48 to 54 years old, ask your caregiver if you should take aspirin to prevent strokes.  Diabetes screening involves taking a blood sample to check your fasting blood sugar level. This should be done once every 3 years, after age 48, if you are within normal weight and without risk factors for diabetes. Testing should be considered at a younger age or be carried out more frequently if you are overweight and have at least 1 risk factor for diabetes.  Breast cancer screening is essential preventative care for women. You should practice "breast self-awareness." This means understanding the normal appearance and feel of your breasts and may include breast self-examination. Any changes detected, no matter how small, should be reported to a caregiver. Women in their 71s and 30s should have a clinical breast exam (CBE) by a caregiver as part of a regular health exam every 1 to 3 years. After age 95, women should have a CBE every year. Starting at age 42, women should consider having a mammogram (breast X-ray) every year. Women who have a family history of breast cancer should talk to their caregiver about genetic screening. Women at a high risk of breast cancer should talk to their caregiver about having an MRI and a mammogram every year.  Breast cancer gene (BRCA)-related cancer risk assessment is recommended for women who have family members with BRCA-related cancers. BRCA-related cancers include breast, ovarian, tubal, and peritoneal cancers. Having family members with these cancers may be associated with an increased risk for harmful changes (mutations) in the breast cancer genes BRCA1 and BRCA2. Results of the assessment will determine the need for genetic counseling and BRCA1 and BRCA2 testing.  The Pap test is a screening test for cervical cancer. Women  should have a Pap test starting at age 45. Between ages 20 and 54, Pap tests should be repeated every 2 years. Beginning at age 50, you should have a Pap test every 3 years as long as the past 3 Pap tests have been normal. If you had a hysterectomy for a problem that was not cancer or a condition that could lead to cancer, then you no longer need Pap tests. If you are between ages 60 and 43, and you have had normal Pap tests going back 10 years, you no longer need Pap tests. If you have had past treatment for cervical cancer or a condition that could lead to cancer, you need Pap tests and screening for cancer for at least 20 years after your treatment. If Pap tests have been discontinued, risk factors (such as a new sexual partner) need to be reassessed to determine if screening should be resumed. Some women have medical problems that increase the chance of getting cervical cancer. In these cases, your caregiver may recommend more frequent screening and Pap tests.  The human papillomavirus (HPV) test is an additional test that may be  used for cervical cancer screening. The HPV test looks for the virus that can cause the cell changes on the cervix. The cells collected during the Pap test can be tested for HPV. The HPV test could be used to screen women aged 72 years and older, and should be used in women of any age who have unclear Pap test results. After the age of 73, women should have HPV testing at the same frequency as a Pap test.  Colorectal cancer can be detected and often prevented. Most routine colorectal cancer screening begins at the age of 32 and continues through age 79. However, your caregiver may recommend screening at an earlier age if you have risk factors for colon cancer. On a yearly basis, your caregiver may provide home test kits to check for hidden blood in the stool. Use of a small camera at the end of a tube, to directly examine the colon (sigmoidoscopy or colonoscopy), can detect the  earliest forms of colorectal cancer. Talk to your caregiver about this at age 62, when routine screening begins. Direct examination of the colon should be repeated every 5 to 10 years through age 58, unless early forms of pre-cancerous polyps or small growths are found.  Hepatitis C blood testing is recommended for all people born from [birth year] through 1966 and any individual with known risks for hepatitis C.  Practice safe sex. Use condoms and avoid high-risk sexual practices to reduce the spread of sexually transmitted infections (STIs). Sexually active women aged 49 and younger should be checked for Chlamydia, which is a common sexually transmitted infection. Older women with new or multiple partners should also be tested for Chlamydia. Testing for other STIs is recommended if you are sexually active and at increased risk.  Osteoporosis is a disease in which the bones lose minerals and strength with aging. This can result in serious bone fractures. The risk of osteoporosis can be identified using a bone density scan. Women ages 35 and over and women at risk for fractures or osteoporosis should discuss screening with their caregivers. Ask your caregiver whether you should be taking a calcium supplement or vitamin D to reduce the rate of osteoporosis.  Menopause can be associated with physical symptoms and risks. Hormone replacement therapy is available to decrease symptoms and risks. You should talk to your caregiver about whether hormone replacement therapy is right for you.  Use sunscreen. Apply sunscreen liberally and repeatedly throughout the day. You should seek shade when your shadow is shorter than you. Protect yourself by wearing long sleeves, pants, a wide-brimmed hat, and sunglasses year round, whenever you are outdoors.  Notify your caregiver of new moles or changes in moles, especially if there is a change in shape or color. Also notify your caregiver if a mole is larger than the size of a  pencil eraser.  Stay current with your immunizations. Document Released: 06/12/2011 Document Revised: 03/24/2013 Document Reviewed: 10/29/2013 [facility] Patient Information 2015 [place], [place]. This information is not intended to replace advice given to you by your health care provider. Make sure you discuss any questions you have with your health care provider.

## 2014-06-24 ENCOUNTER — Telehealth: Payer: Self-pay | Admitting: Internal Medicine

## 2014-06-24 NOTE — Telephone Encounter (Signed)
Relevant patient education mailed to patient.  

## 2014-08-26 ENCOUNTER — Encounter: Payer: Self-pay | Admitting: Internal Medicine

## 2014-12-17 ENCOUNTER — Ambulatory Visit (INDEPENDENT_AMBULATORY_CARE_PROVIDER_SITE_OTHER): Payer: BLUE CROSS/BLUE SHIELD | Admitting: *Deleted

## 2014-12-17 ENCOUNTER — Ambulatory Visit: Payer: BLUE CROSS/BLUE SHIELD

## 2014-12-17 DIAGNOSIS — Z23 Encounter for immunization: Secondary | ICD-10-CM

## 2015-06-03 LAB — HM COLONOSCOPY: HM Colonoscopy: NORMAL

## 2015-06-10 ENCOUNTER — Encounter: Payer: Self-pay | Admitting: Internal Medicine

## 2015-08-12 ENCOUNTER — Other Ambulatory Visit: Payer: Self-pay | Admitting: Obstetrics and Gynecology

## 2015-08-13 LAB — CYTOLOGY - PAP

## 2015-09-09 ENCOUNTER — Telehealth: Payer: Self-pay | Admitting: Internal Medicine

## 2015-09-09 ENCOUNTER — Other Ambulatory Visit: Payer: Self-pay | Admitting: Internal Medicine

## 2015-09-09 DIAGNOSIS — A481 Legionnaires' disease: Secondary | ICD-10-CM

## 2015-09-09 NOTE — Telephone Encounter (Signed)
[facility] - Client TELEPHONE ADVICE RECORD [facility] Patient Name: [name] [name] DOB: Apr 15, 1961 Initial Comment Caller states they stayed at a hotel recently and has been advised they may have been exposed to Legionnaires. Had some questions about this Nurse Assessment Nurse: Ladona Ridgel, RN, Sunny Schlein Date/Time (Eastern Time): 09/09/2015 4:31:28 PM Confirm and document reason for call. If symptomatic, describe symptoms. ---PT was watching the news and it said that she was exposed to legionnaires d. It smelled bad in the room and she has recieved emails from hotel per [facility] requirement. She knows the symptoms. Her only symptoms is a little muscle ache which is her usual. It was around August 10th. Has the patient traveled out of the country within the last 30 days? ---No Does the patient require triage? ---Yes Related visit to physician within the last 2 weeks? ---No Does the PT have any chronic conditions? (i.e. diabetes, asthma, etc.) ---NoGuidelines Guideline Title Affirmed Question Affirmed Notes Final Disposition User Clinical Call Gaddy, RN, Felicia Comments NO quidleline for legionnaires' d exposure. Pt has no symptoms. Her only risk factor is that she was a smoker and quit 6 months ago. Shared info from [facility] and told her call back for any symptoms. Also told her that fatigue can be a symptom of pneumonia - sometimes in individuals that do not know they have pneumonia. So call back for fatigue. She already has list of symptoms to watch for from [facility]. Causes and Common Sources of Infection Legionella is a type of bacterium found naturally in freshwater environments, like lakes and streams. It can become a health concern when it grows and spreads in human-made water systems like Hot tubs that arent drained after each use Hot water tanks and heaters Large plumbing systems Cooling towers (airconditioning units for large buildings) Decorative  fountains Home and car air-conditioning units do not use water to cool the air, so they are not a risk for Legionella growth. This bacterium grows best in warm water. How it Spreads After Legionella grows and multiplies in a building water system, that contaminated water then has to spread in droplets small enough for people to breathe in. People are exposed to Legionella when they She asked if there are tests for this: Diagnosis, Testing, and Treatment the only way to tell if a pneumonia patient has Legionnaires disease is by getting a specific diagnostic test. Indications that warrant testing include: Patients who have failed outpatient antibiotic therapy for community-acquired pneumonia Patients with severe pneumonia, PLEASE NOTE: All timestamps contained within this report are represented as [place] Standard Time. Call Id: [id number] Comments in particular those requiring intensive care Immunocompromised patients with pneumonia Patients with pneumonia in the setting of a Legionnaires disease outbreak Patients with a travel history within 2 weeks before the onset of illness referenced: http:// [employer]xC[employer].hu.htmlPLEASE NOTE: All timestamps contained within this report are represented as [place] Standard Time. Call Id: [id number]  No fever, No cough and not SOB (only thing is slightly achy in muscles in her legs but she thinks that is being back at work and walking - no HA, no congestion reached [place] nurse in the office to see what Dr. Felicity Coyer things about this - The nurse said she can come and be tested. Come to lab at her convenience tom or next week and they will test antigen to see if she came in contact. She said if it is positive they will culture it to confirm. These tests take about 72 h to  come back. Gave this information to Pt and she said she will do this. Told her Lab is open 7:30 am to 5 pm Clarification to prelimary note - she stayed  in the hotel whereshe could've been exposed to Legionnaires' around August 10th

## 2015-09-09 NOTE — Telephone Encounter (Signed)
[facility] called and stated that pt received a letter from a hotel and it stated she may have been exposed to legionella.

## 2015-09-09 NOTE — Telephone Encounter (Signed)
Pt called stated that her insurance is not going to pay for it unless its 3 month supply, please change the rx.

## 2015-09-09 NOTE — Telephone Encounter (Signed)
Test has been ordered. Pt will be down in the morning to have the test drawn.

## 2015-09-10 ENCOUNTER — Telehealth: Payer: Self-pay | Admitting: *Deleted

## 2015-09-10 MED ORDER — PRAVASTATIN SODIUM 20 MG PO TABS: 20.0000 mg | ORAL_TABLET | Freq: Every day | ORAL | Status: DC

## 2015-09-10 MED ORDER — HYDROCHLOROTHIAZIDE 12.5 MG PO TABS: 12.5000 mg | ORAL_TABLET | Freq: Every day | ORAL | Status: DC

## 2015-09-10 MED ORDER — LOSARTAN POTASSIUM 25 MG PO TABS: 25.0000 mg | ORAL_TABLET | Freq: Every day | ORAL | Status: DC

## 2015-09-10 NOTE — Telephone Encounter (Signed)
Receive call pt states nurse sent in 30 day on medications, but due to insurance will only cover 90. Pt has made appt to see md 11/24/15, Inform pt will resend for 90, but will have to keep appt for additional refills...Raechel Chute

## 2015-09-13 ENCOUNTER — Other Ambulatory Visit: Payer: BLUE CROSS/BLUE SHIELD

## 2015-09-13 DIAGNOSIS — A481 Legionnaires' disease: Secondary | ICD-10-CM

## 2015-09-16 LAB — LEGIONELLA PNEUMOPHILA TOTAL AB: Serogroups 2,3,4,5,6,8: <1:16 {titer}

## 2015-11-24 ENCOUNTER — Other Ambulatory Visit (INDEPENDENT_AMBULATORY_CARE_PROVIDER_SITE_OTHER): Payer: BLUE CROSS/BLUE SHIELD

## 2015-11-24 ENCOUNTER — Ambulatory Visit (INDEPENDENT_AMBULATORY_CARE_PROVIDER_SITE_OTHER): Payer: BLUE CROSS/BLUE SHIELD | Admitting: Internal Medicine

## 2015-11-24 ENCOUNTER — Encounter: Payer: Self-pay | Admitting: Internal Medicine

## 2015-11-24 VITALS — BP 128/78 | HR 70 | Temp 97.6°F | Ht 65.0 in | Wt 188.0 lb

## 2015-11-24 DIAGNOSIS — E785 Hyperlipidemia, unspecified: Secondary | ICD-10-CM | POA: Diagnosis not present

## 2015-11-24 DIAGNOSIS — R7989 Other specified abnormal findings of blood chemistry: Secondary | ICD-10-CM

## 2015-11-24 DIAGNOSIS — E669 Obesity, unspecified: Secondary | ICD-10-CM | POA: Insufficient documentation

## 2015-11-24 DIAGNOSIS — E042 Nontoxic multinodular goiter: Secondary | ICD-10-CM | POA: Diagnosis not present

## 2015-11-24 DIAGNOSIS — I10 Essential (primary) hypertension: Secondary | ICD-10-CM | POA: Diagnosis not present

## 2015-11-24 DIAGNOSIS — Z1159 Encounter for screening for other viral diseases: Secondary | ICD-10-CM

## 2015-11-24 DIAGNOSIS — Z0000 Encounter for general adult medical examination without abnormal findings: Secondary | ICD-10-CM

## 2015-11-24 DIAGNOSIS — Z114 Encounter for screening for human immunodeficiency virus [HIV]: Secondary | ICD-10-CM

## 2015-11-24 DIAGNOSIS — R0789 Other chest pain: Secondary | ICD-10-CM

## 2015-11-24 DIAGNOSIS — Z23 Encounter for immunization: Secondary | ICD-10-CM | POA: Diagnosis not present

## 2015-11-24 LAB — HEPATIC FUNCTION PANEL
ALBUMIN: 4.3 g/dL (ref 3.5–5.2)
ALK PHOS: 85 U/L (ref 39–117)
ALT: 14 U/L (ref 0–35)
AST: 15 U/L (ref 0–37)
BILIRUBIN DIRECT: 0.1 mg/dL (ref 0.0–0.3)
TOTAL PROTEIN: 6.8 g/dL (ref 6.0–8.3)
Total Bilirubin: 0.4 mg/dL (ref 0.2–1.2)

## 2015-11-24 LAB — URINALYSIS, ROUTINE W REFLEX MICROSCOPIC
Hgb urine dipstick: NEGATIVE
Ketones, ur: NEGATIVE
Leukocytes, UA: NEGATIVE
Nitrite: NEGATIVE
RBC / HPF: NONE SEEN (ref 0–?)
Total Protein, Urine: NEGATIVE
URINE GLUCOSE: NEGATIVE
Urobilinogen, UA: 0.2 (ref 0.0–1.0)
pH: 5.5 (ref 5.0–8.0)

## 2015-11-24 LAB — BASIC METABOLIC PANEL
BUN: 15 mg/dL (ref 6–23)
CALCIUM: 9.2 mg/dL (ref 8.4–10.5)
CO2: 33 meq/L — AB (ref 19–32)
CREATININE: 0.79 mg/dL (ref 0.40–1.20)
Chloride: 103 mEq/L (ref 96–112)
GFR: 80.24 mL/min (ref >60.00)
Glucose, Bld: 106 mg/dL — ABNORMAL HIGH (ref 70–99)
Potassium: 3.6 mEq/L (ref 3.5–5.1)
SODIUM: 143 meq/L (ref 135–145)

## 2015-11-24 LAB — CBC WITH DIFFERENTIAL/PLATELET
BASOS ABS: 0 10*3/uL (ref 0.0–0.1)
Basophils Relative: 0.5 % (ref 0.0–3.0)
EOS ABS: 0.5 10*3/uL (ref 0.0–0.7)
Eosinophils Relative: 7.4 % — ABNORMAL HIGH (ref 0.0–5.0)
HEMATOCRIT: 41 % (ref 36.0–46.0)
HEMOGLOBIN: 13.7 g/dL (ref 12.0–15.0)
LYMPHS PCT: 29 % (ref 12.0–46.0)
Lymphs Abs: 1.9 10*3/uL (ref 0.7–4.0)
MCHC: 33.5 g/dL (ref 30.0–36.0)
MCV: 90.4 fl (ref 78.0–100.0)
MONOS PCT: 5.9 % (ref 3.0–12.0)
Monocytes Absolute: 0.4 10*3/uL (ref 0.1–1.0)
Neutro Abs: 3.8 10*3/uL (ref 1.4–7.7)
Neutrophils Relative %: 57.2 % (ref 43.0–77.0)
Platelets: 244 10*3/uL (ref 150.0–400.0)
RBC: 4.53 Mil/uL (ref 3.87–5.11)
RDW: 13.9 % (ref 11.5–15.5)
WBC: 6.6 10*3/uL (ref 4.0–10.5)

## 2015-11-24 LAB — LIPID PANEL
CHOL/HDL RATIO: 4
CHOLESTEROL: 188 mg/dL (ref 0–200)
HDL: 41.9 mg/dL (ref >39.00)
NonHDL: 145.81
Triglycerides: 258 mg/dL — ABNORMAL HIGH (ref 0.0–149.0)
VLDL: 51.6 mg/dL — AB (ref 0.0–40.0)

## 2015-11-24 LAB — TSH: TSH: 0.84 u[IU]/mL (ref 0.35–4.50)

## 2015-11-24 LAB — LDL CHOLESTEROL, DIRECT: LDL DIRECT: 117 mg/dL

## 2015-11-24 MED ORDER — LOSARTAN POTASSIUM 25 MG PO TABS: 25.0000 mg | ORAL_TABLET | Freq: Every day | ORAL | Status: DC

## 2015-11-24 MED ORDER — HYDROCHLOROTHIAZIDE 12.5 MG PO TABS: 12.5000 mg | ORAL_TABLET | Freq: Every day | ORAL | Status: DC

## 2015-11-24 MED ORDER — PRAVASTATIN SODIUM 20 MG PO TABS: 20.0000 mg | ORAL_TABLET | Freq: Every day | ORAL | Status: DC

## 2015-11-24 NOTE — Assessment & Plan Note (Signed)
Hx related stress and anxiety symptoms + variable med compliance resumed losartan + HCTZ for BP control 02/2013 - doing well The current medical regimen is effective;  continue present plan and medications.  BP Readings from Last 3 Encounters:  11/24/15 128/78  06/23/14 120/68  03/03/13 140/82

## 2015-11-24 NOTE — Assessment & Plan Note (Signed)
Hx same 2002: cold nodule s/p bx: benign Increase trouble swallow intermittently - recheck US now

## 2015-11-24 NOTE — Assessment & Plan Note (Signed)
Started statin 10/2009 - prava Recheck annually with LFTs

## 2015-11-24 NOTE — Assessment & Plan Note (Signed)
Wt Readings from Last 3 Encounters:  11/24/15 188 lb (85.276 kg)  06/23/14 188 lb 2 oz (85.333 kg)  03/03/13 181 lb 12.8 oz (82.464 kg)   Body mass index is 31.28 kg/(m^2). Weight trends reviewed, increased weight since tobacco cessation January 2016. Also lifestyle changes with increased travel and eating out related to work which are not conducive to weight loss Discussed potential lifestyle intervention changes as well as prescription therapy like Saxenda Check screening labs today and focus on lifestyle control. Patient will contact us in 2017 if prescription therapy is desired

## 2015-11-24 NOTE — Patient Instructions (Addendum)
It was good to see you today.  We have reviewed your prior records including labs and tests today  Health Maintenance reviewed - all recommended immunizations and age-appropriate screenings are up-to-date.  Test(s) ordered today. Your results will be released to [place] (or called to you) after review, usually within 72hours after test completion. If any changes need to be made, you will be notified at that same time.  EKG performed today. Will also refer for cardiac stress test and for thyroid ultrasound as discussed. My office will call regarding these appointments once made, then he will be contacted with results after review   Your medications have been reviewed and updated, no changes recommended at this time. Refill on medication(s) as discussed today.  We will make referral for scheduled follow-up with new primary care provider at [place] in 12 months for annual exam and labs, call sooner if problems.   Health Maintenance, Female Adopting a healthy lifestyle and getting preventive care can go a long way to promote health and wellness. Talk with your health care provider about what schedule of regular examinations is right for you. This is a good chance for you to check in with your provider about disease prevention and staying healthy. In between checkups, there are plenty of things you can do on your own. Experts have done a lot of research about which lifestyle changes and preventive measures are most likely to keep you healthy. Ask your health care provider for more information. WEIGHT AND DIET  Eat a healthy diet  Be sure to include plenty of vegetables, fruits, low-fat dairy products, and lean protein.  Do not eat a lot of foods high in solid fats, added sugars, or salt.  Get regular exercise. This is one of the most important things you can do for your health.  Most adults should exercise for at least 150 minutes each week. The exercise should  increase your heart rate and make you sweat (moderate-intensity exercise).  Most adults should also do strengthening exercises at least twice a week. This is in addition to the moderate-intensity exercise.  Maintain a healthy weight  Body mass index (BMI) is a measurement that can be used to identify possible weight problems. It estimates body fat based on height and weight. Your health care provider can help determine your BMI and help you achieve or maintain a healthy weight.  For females 55 years of age and older:   A BMI below 18.5 is considered underweight.  A BMI of 18.5 to 24.9 is normal.  A BMI of 25 to 29.9 is considered overweight.  A BMI of 30 and above is considered obese.  Watch levels of cholesterol and blood lipids  You should start having your blood tested for lipids and cholesterol at 55 years of age, then have this test every 5 years.  You may need to have your cholesterol levels checked more often if:  Your lipid or cholesterol levels are high.  You are older than 55 years of age.  You are at high risk for heart disease.  CANCER SCREENING   Lung Cancer  Lung cancer screening is recommended for adults 61-89 years old who are at high risk for lung cancer because of a history of smoking.  A yearly low-dose CT scan of the lungs is recommended for people who:  Currently smoke.  Have quit within the past 15 years.  Have at least a 30-pack-year history of smoking. A pack year is smoking  an average of one pack of cigarettes a day for 1 year.  Yearly screening should continue until it has been 15 years since you quit.  Yearly screening should stop if you develop a health problem that would prevent you from having lung cancer treatment.  Breast Cancer  Practice breast self-awareness. This means understanding how your breasts normally appear and feel.  It also means doing regular breast self-exams. Let your health care provider know about any changes, no  matter how small.  If you are in your 20s or 30s, you should have a clinical breast exam (CBE) by a health care provider every 1-3 years as part of a regular health exam.  If you are 26 or older, have a CBE every year. Also consider having a breast X-ray (mammogram) every year.  If you have a family history of breast cancer, talk to your health care provider about genetic screening.  If you are at high risk for breast cancer, talk to your health care provider about having an MRI and a mammogram every year.  Breast cancer gene (BRCA) assessment is recommended for women who have family members with BRCA-related cancers. BRCA-related cancers include:  Breast.  Ovarian.  Tubal.  Peritoneal cancers.  Results of the assessment will determine the need for genetic counseling and BRCA1 and BRCA2 testing. Cervical Cancer Your health care provider may recommend that you be screened regularly for cancer of the pelvic organs (ovaries, uterus, and vagina). This screening involves a pelvic examination, including checking for microscopic changes to the surface of your cervix (Pap test). You may be encouraged to have this screening done every 3 years, beginning at age 55.  For women ages 54-65, health care providers may recommend pelvic exams and Pap testing every 3 years, or they may recommend the Pap and pelvic exam, combined with testing for human papilloma virus (HPV), every 5 years. Some types of HPV increase your risk of cervical cancer. Testing for HPV may also be done on women of any age with unclear Pap test results.  Other health care providers may not recommend any screening for nonpregnant women who are considered low risk for pelvic cancer and who do not have symptoms. Ask your health care provider if a screening pelvic exam is right for you.  If you have had past treatment for cervical cancer or a condition that could lead to cancer, you need Pap tests and screening for cancer for at least  20 years after your treatment. If Pap tests have been discontinued, your risk factors (such as having a new sexual partner) need to be reassessed to determine if screening should resume. Some women have medical problems that increase the chance of getting cervical cancer. In these cases, your health care provider may recommend more frequent screening and Pap tests. Colorectal Cancer  This type of cancer can be detected and often prevented.  Routine colorectal cancer screening usually begins at 55 years of age and continues through 55 years of age.  Your health care provider may recommend screening at an earlier age if you have risk factors for colon cancer.  Your health care provider may also recommend using home test kits to check for hidden blood in the stool.  A small camera at the end of a tube can be used to examine your colon directly (sigmoidoscopy or colonoscopy). This is done to check for the earliest forms of colorectal cancer.  Routine screening usually begins at age 55.  Direct examination of the colon  should be repeated every 5-10 years through 55 years of age. However, you may need to be screened more often if early forms of precancerous polyps or small growths are found. Skin Cancer  Check your skin from head to toe regularly.  Tell your health care provider about any new moles or changes in moles, especially if there is a change in a mole's shape or color.  Also tell your health care provider if you have a mole that is larger than the size of a pencil eraser.  Always use sunscreen. Apply sunscreen liberally and repeatedly throughout the day.  Protect yourself by wearing long sleeves, pants, a wide-brimmed hat, and sunglasses whenever you are outside. HEART DISEASE, DIABETES, AND HIGH BLOOD PRESSURE   High blood pressure causes heart disease and increases the risk of stroke. High blood pressure is more likely to develop in:  People who have blood pressure in the high end  of the normal range (130-139/85-89 mm Hg).  People who are overweight or obese.  People who are African American.  If you are 67-55 years of age, have your blood pressure checked every 3-5 years. If you are 32 years of age or older, have your blood pressure checked every year. You should have your blood pressure measured twice--once when you are at a hospital or clinic, and once when you are not at a hospital or clinic. Record the average of the two measurements. To check your blood pressure when you are not at a hospital or clinic, you can use:  An automated blood pressure machine at a pharmacy.  A home blood pressure monitor.  If you are between 11 years and 74 years old, ask your health care provider if you should take aspirin to prevent strokes.  Have regular diabetes screenings. This involves taking a blood sample to check your fasting blood sugar level.  If you are at a normal weight and have a low risk for diabetes, have this test once every three years after 55 years of age.  If you are overweight and have a high risk for diabetes, consider being tested at a younger age or more often. PREVENTING INFECTION  Hepatitis B  If you have a higher risk for hepatitis B, you should be screened for this virus. You are considered at high risk for hepatitis B if:  You were born in a country where hepatitis B is common. Ask your health care provider which countries are considered high risk.  Your parents were born in a high-risk country, and you have not been immunized against hepatitis B (hepatitis B vaccine).  You have HIV or AIDS.  You use needles to inject street drugs.  You live with someone who has hepatitis B.  You have had sex with someone who has hepatitis B.  You get hemodialysis treatment.  You take certain medicines for conditions, including cancer, organ transplantation, and autoimmune conditions. Hepatitis C  Blood testing is recommended for:  Everyone born from  [birth year] through 1966.  Anyone with known risk factors for hepatitis C. Sexually transmitted infections (STIs)  You should be screened for sexually transmitted infections (STIs) including gonorrhea and chlamydia if:  You are sexually active and are younger than 55 years of age.  You are older than 55 years of age and your health care provider tells you that you are at risk for this type of infection.  Your sexual activity has changed since you were last screened and you are at an increased risk for  chlamydia or gonorrhea. Ask your health care provider if you are at risk.  If you do not have HIV, but are at risk, it may be recommended that you take a prescription medicine daily to prevent HIV infection. This is called pre-exposure prophylaxis (PrEP). You are considered at risk if:  You are sexually active and do not regularly use condoms or know the HIV status of your partner(s).  You take drugs by injection.  You are sexually active with a partner who has HIV. Talk with your health care provider about whether you are at high risk of being infected with HIV. If you choose to begin PrEP, you should first be tested for HIV. You should then be tested every 3 months for as long as you are taking PrEP.  PREGNANCY   If you are premenopausal and you may become pregnant, ask your health care provider about preconception counseling.  If you may become pregnant, take 400 to 800 micrograms (mcg) of folic acid every day.  If you want to prevent pregnancy, talk to your health care provider about birth control (contraception). OSTEOPOROSIS AND MENOPAUSE   Osteoporosis is a disease in which the bones lose minerals and strength with aging. This can result in serious bone fractures. Your risk for osteoporosis can be identified using a bone density scan.  If you are 6 years of age or older, or if you are at risk for osteoporosis and fractures, ask your health care provider if you should be screened.  Ask  your health care provider whether you should take a calcium or vitamin D supplement to lower your risk for osteoporosis.  Menopause may have certain physical symptoms and risks.  Hormone replacement therapy may reduce some of these symptoms and risks. Talk to your health care provider about whether hormone replacement therapy is right for you.  HOME CARE INSTRUCTIONS   Schedule regular health, dental, and eye exams.  Stay current with your immunizations.   Do not use any tobacco products including cigarettes, chewing tobacco, or electronic cigarettes.  If you are pregnant, do not drink alcohol.  If you are breastfeeding, limit how much and how often you drink alcohol.  Limit alcohol intake to no more than 1 drink per day for nonpregnant women. One drink equals 12 ounces of beer, 5 ounces of wine, or 1 ounces of hard liquor.  Do not use street drugs.  Do not share needles.  Ask your health care provider for help if you need support or information about quitting drugs.  Tell your health care provider if you often feel depressed.  Tell your health care provider if you have ever been abused or do not feel safe at home.   This information is not intended to replace advice given to you by your health care provider. Make sure you discuss any questions you have with your health care provider.   Document Released: 06/12/2011 Document Revised: 12/18/2014 Document Reviewed: 10/29/2013 Elsevier Interactive Patient Education [employer].

## 2015-11-24 NOTE — Progress Notes (Signed)
Subjective:    Patient ID: [name] [name], female    DOB: 05/15/1961, 54 y.o.   MRN: [id number]  HPI  patient is here today for annual physical. Patient feels well in general. Also reviewed chronic medical conditions, interval events and current concerns  Past Medical History  Diagnosis Date  . ANXIETY   . ARTHRITIS   . ASTHMA   . DEGENERATIVE JOINT DISEASE   . EMPHYSEMA, MILD   . GALLBLADDER DISEASE, HX OF   . HYPERLIPIDEMIA   . HYPERTENSION   . TOBACCO ABUSE     hx of; quit 1/22016  . Vitamin D deficiency   . Multinodular goiter 05/2001 dx    s/p bx: benign   Family History  Problem Relation Age of Onset  . Alcohol abuse Father 10    sober since age 67  . Arthritis Father   . Lung cancer Paternal Grandmother   . Hypertension Mother   . Hyperlipidemia Mother   . Hypertension Father   . Hyperlipidemia Father   . Rectal cancer Maternal Grandmother   . Stroke Mother 58    L [place]  . Lung cancer Father 48    smoker   Social History  Substance Use Topics  . Smoking status: Former Smoker    Quit date: 12/11/2014  . Smokeless tobacco: Never Used  . Alcohol Use: No    Review of Systems  Constitutional: Negative for fatigue and unexpected weight change.  HENT: Positive for trouble swallowing (occ, ?goiter - hx same in 2002).   Respiratory: Positive for chest tightness (quick L side not activity related). Negative for cough, shortness of breath and wheezing.   Cardiovascular: Negative for chest pain, palpitations and leg swelling.  Gastrointestinal: Negative for nausea, abdominal pain and diarrhea.  Neurological: Negative for dizziness, weakness, light-headedness and headaches.  Psychiatric/Behavioral: Negative for dysphoric mood. The patient is not nervous/anxious.   All other systems reviewed and are negative.      Objective:    Physical Exam  Constitutional: She is oriented to person, place, and time. She appears well-developed and well-nourished. No distress.    obese  HENT:  Head: Normocephalic and atraumatic.  Right Ear: External ear normal.  Left Ear: External ear normal.  Nose: Nose normal.  Mouth/Throat: Oropharynx is clear and moist. No oropharyngeal exudate.  Eyes: EOM are normal. Pupils are equal, round, and reactive to light. Right eye exhibits no discharge. Left eye exhibits no discharge. No scleral icterus.  Neck: Normal range of motion. Neck supple. No JVD present. No tracheal deviation present. No thyromegaly present.  Cardiovascular: Normal rate, regular rhythm, normal heart sounds and intact distal pulses.  Exam reveals no friction rub.   No murmur heard. Pulmonary/Chest: Effort normal and breath sounds normal. No respiratory distress. She has no wheezes. She has no rales. She exhibits no tenderness.  Abdominal: Soft. Bowel sounds are normal. She exhibits no distension and no mass. There is no tenderness. There is no rebound and no guarding.  Musculoskeletal: Normal range of motion.  Lymphadenopathy:    She has no cervical adenopathy.  Neurological: She is alert and oriented to person, place, and time. She has normal reflexes. No cranial nerve deficit.  Skin: Skin is warm and dry. No rash noted. She is not diaphoretic. No erythema.  Psychiatric: She has a normal mood and affect. Her behavior is normal. Judgment and thought content normal.  Nursing note and vitals reviewed.   BP 128/78 mmHg  Pulse 70  Temp(Src) 97.6  F (36.4 C) (Oral)  Ht  (1.651 m)  Wt 188 lb (85.276 kg)  BMI 31.28 kg/m2  SpO2 94% Wt Readings from Last 3 Encounters:  11/24/15 188 lb (85.276 kg)  06/23/14 188 lb 2 oz (85.333 kg)  03/03/13 181 lb 12.8 oz (82.464 kg)    Lab Results  Component Value Date   WBC 5.7 03/03/2013   HGB 13.2 03/03/2013   HCT 38.3 03/03/2013   PLT 225.0 03/03/2013   GLUCOSE 104* 03/03/2013   CHOL 152 03/03/2013   TRIG 119.0 03/03/2013   HDL 40.90 03/03/2013   LDLDIRECT 132.0 10/09/2011   LDLCALC 87 03/03/2013   ALT  19 03/03/2013   AST 20 03/03/2013   NA 139 03/03/2013   K 3.4* 03/03/2013   CL 102 03/03/2013   CREATININE 0.7 03/03/2013   BUN 13 03/03/2013   CO2 31 03/03/2013   TSH 0.59 03/03/2013   INR 1.0 07/01/2007   HGBA1C 5.7 10/09/2011    No results found.     Assessment & Plan:   CPX/z00.00 -Patient has been counseled on age-appropriate routine health concerns for screening and prevention. These are reviewed and up-to-date. Immunizations are up-to-date or declined. Labs ordered and reviewed.  Atypical chest pain. Symptoms not suggestive of angina but given family history, personal risk factors for CAD including medicated hypertension and dyslipidemia with former tobacco abuse history, EKG today and refer for exercise treadmill stress test -continue aspirin 81 mg daily and work on medical modification of risk factors  Problem List Items Addressed This Visit    Dyslipidemia    Started statin 10/2009 - prava Recheck annually with LFTs      Relevant Orders   Exercise Tolerance Test   Essential hypertension    Hx related stress and anxiety symptoms + variable med compliance resumed losartan + HCTZ for BP control 02/2013 - doing well The current medical regimen is effective;  continue present plan and medications.  BP Readings from Last 3 Encounters:  11/24/15 128/78  06/23/14 120/68  03/03/13 140/82        Relevant Orders   Exercise Tolerance Test   Multinodular goiter    Hx same 2002: cold nodule s/p bx: benign Increase trouble swallow intermittently - recheck [place] now      Relevant Orders   TSH   US Soft Tissue Head/Neck   Obese    Wt Readings from Last 3 Encounters:  11/24/15 188 lb (85.276 kg)  06/23/14 188 lb 2 oz (85.333 kg)  03/03/13 181 lb 12.8 oz (82.464 kg)   Body mass index is 31.28 kg/(m^2). Weight trends reviewed, increased weight since tobacco cessation January 2016. Also lifestyle changes with increased travel and eating out related to work which are not  conducive to weight loss Discussed potential lifestyle intervention changes as well as prescription therapy like Saxenda Check screening labs today and focus on lifestyle control. Patient will contact us in 2017 if prescription therapy is desired      Relevant Orders   Exercise Tolerance Test    Other Visit Diagnoses    Routine general medical examination at a health care facility    -  Primary    Relevant Orders    Basic metabolic panel    CBC with Differential/Platelet    Hepatic function panel    Lipid panel    TSH    Urinalysis, Routine w reflex microscopic (not at [facility])    Need for prophylactic vaccination and inoculation against influenza  Relevant Orders    Flu Vaccine QUAD 36+ mos IM (Completed)    Atypical chest pain        Relevant Orders    Exercise Tolerance Test    EKG 12-Lead    Screening for HIV without presence of risk factors        Relevant Orders    HIV antibody    Need for hepatitis C screening test        Relevant Orders    Hepatitis C antibody        Rene PaciValerie Nikitas Davtyan, MD

## 2015-11-24 NOTE — Progress Notes (Signed)
Pre visit review using our clinic review tool, if applicable. No additional management support is needed unless otherwise documented below in the visit note. 

## 2015-11-25 LAB — HEPATITIS C ANTIBODY: HCV Ab: NEGATIVE

## 2015-11-25 LAB — HIV ANTIBODY (ROUTINE TESTING W REFLEX): HIV 1&2 Ab, 4th Generation: NONREACTIVE

## 2015-11-29 NOTE — Progress Notes (Signed)
Please call patient an schedule her a 30 minute office visit to establish with Sandford CrazeMelissa [name]

## 2015-11-29 NOTE — Progress Notes (Signed)
LM for patient to call and schedule appt for 11/2016

## 2015-11-30 ENCOUNTER — Encounter: Payer: BLUE CROSS/BLUE SHIELD | Admitting: Physician Assistant

## 2015-12-07 ENCOUNTER — Ambulatory Visit
Admission: RE | Admit: 2015-12-07 | Discharge: 2015-12-07 | Disposition: A | Discharge status: Home / Self Care | Payer: BLUE CROSS/BLUE SHIELD | Source: Ambulatory Visit | Attending: Internal Medicine | Admitting: Internal Medicine

## 2015-12-07 DIAGNOSIS — E042 Nontoxic multinodular goiter: Secondary | ICD-10-CM

## 2015-12-08 ENCOUNTER — Other Ambulatory Visit: Payer: Self-pay | Admitting: Internal Medicine

## 2015-12-08 ENCOUNTER — Other Ambulatory Visit: Payer: Self-pay | Admitting: Family

## 2015-12-08 DIAGNOSIS — E042 Nontoxic multinodular goiter: Secondary | ICD-10-CM

## 2016-01-19 ENCOUNTER — Ambulatory Visit (INDEPENDENT_AMBULATORY_CARE_PROVIDER_SITE_OTHER): Payer: 59 | Admitting: Endocrinology

## 2016-01-19 ENCOUNTER — Encounter: Payer: Self-pay | Admitting: Endocrinology

## 2016-01-19 VITALS — BP 120/60 | HR 64 | Temp 99.0°F | Ht 64.0 in | Wt 193.8 lb

## 2016-01-19 DIAGNOSIS — E042 Nontoxic multinodular goiter: Secondary | ICD-10-CM | POA: Diagnosis not present

## 2016-01-19 NOTE — Progress Notes (Signed)
Patient ID: [name] [name], female   DOB: August 14, 1961, 55 y.o.   MRN: [id number]           Reason for Appointment: Goiter, new consultation    History of Present Illness:   The patient's thyroid enlargement was first discovered in 2002 on a clinical exam At that time she was found to have a dominant right sided 1.8 cm nodule which was cold and reportedly the biopsy was benign She was not having any subjective thyroid swelling at that time  When she was having her annual physical examination in 12/16 she was recommended to follow-up ultrasound by her PCP  She has had no difficulty with swallowing except a little with certain foods like Rice    Does not feel like she has any choking sensation in her neck or pressure in any position or when lying down.  Lab Results  Component Value Date   TSH 0.84 11/24/2015   TSH 0.59 03/03/2013   TSH 1.10 10/09/2011   FREET4 0.75 10/09/2011     She has had an ultrasound exam in 12/16 which showed the following:  Right thyroid lobe  Measurements: 4.2 cm x 1.4 cm x 1.7 cm. Multiple right-sided thyroid nodules. Majority of which measure less than 4 mm.  Dominant nodule inferior measuring 1.5 cm x 1.0 cm x 1.4 cm. Internal reflectors compatible with calcium or colloid.  Adjacent nodule measures 1.2 cm x 8 mm x 1.0 cm with internal colloid or calcium. Uncertain if these are separate nodules or part of the same process.  Left thyroid lobe  Measurements: 3.7 cm x 1.2 cm x 1.6 cm.  Adjacent nodules on the left, which may be part of the same nodule or 3 separate nodules. Greatest diameter is 1.8 cm x 8 mm x 1.0 cm.  ULTRASOUND in 2002 showed the following:  AT THE SITE OF THE COLD NODULE IN THE LATERAL ASPECT OF THE RIGHT LOBE, THERE IS A SEPTATED MIXED ECHOGENIC MASS MEASURING 1.8 X 1.2 X 1.3 CM. ON THE RIGHT, THERE IS AT LEAST ONE OTHER SOLID APPEARING OVAL NODULE PRESENT OF 5 X 9 X 6 MM.  THE RIGHT LOBE MEASURES 4.1 CM SAGITTALLY WITH A DEPTH  OF 1.7 CM AND WIDTH OF 1.5 CM.   THE LEFT LOBE MEASURES 3.6 CM SAGITTALLY WITH A DEPTH OF 1.4 CM AND WIDTH, 1.5 CM. WITHIN THE LEFT LOBE, THERE ARE SEVERAL HYPOECHOIC STRUCTURES PRESENT AS WELL.      Medication List       This list is accurate as of: 01/19/16  2:41 PM.  Always use your most recent med list.               aspirin 81 MG tablet  Take 81 mg by mouth daily.     hydrochlorothiazide 12.5 MG tablet  Commonly known as:  HYDRODIURIL  Take 1 tablet (12.5 mg total) by mouth daily.     losartan 25 MG tablet  Commonly known as:  COZAAR  Take 1 tablet (25 mg total) by mouth daily.     multivitamin tablet  Take 1 tablet by mouth daily.     pravastatin 20 MG tablet  Commonly known as:  PRAVACHOL  Take 1 tablet (20 mg total) by mouth daily.        Allergies:  Allergies  Allergen Reactions  . Codeine     REACTION: makes pt sick    Past Medical History  Diagnosis Date  . ANXIETY   . ARTHRITIS   .  ASTHMA   . DEGENERATIVE JOINT DISEASE   . EMPHYSEMA, MILD   . GALLBLADDER DISEASE, HX OF   . HYPERLIPIDEMIA   . HYPERTENSION   . TOBACCO ABUSE     hx of; quit 1/22016  . Vitamin D deficiency   . Multinodular goiter 05/2001 dx    s/p bx: benign    Past Surgical History  Procedure Laterality Date  . Cholecystectomy  2008  . Joint replacement Right 1998    total hip  . Thyroid biopsy  2002    Family History  Problem Relation Age of Onset  . Alcohol abuse Father 47    sober since age 53  . Arthritis Father   . Lung cancer Paternal Grandmother   . Hypertension Mother   . Hyperlipidemia Mother   . Hypertension Father   . Hyperlipidemia Father   . Rectal cancer Maternal Grandmother   . Stroke Mother 16    L HP  . Lung cancer Father 38    smoker    Social History:  reports that she quit smoking about 13 months ago. She has never used smokeless tobacco. She reports that she does not drink alcohol or use illicit drugs.   Review of Systems:  Review  of Systems  Constitutional: Positive for weight gain.       Has difficulty losing weight  Cardiovascular: Negative for palpitations.  Gastrointestinal: Positive for nausea.  Endocrine: Negative for fatigue.  Neurological: Negative for weakness.      Examination:   BP 120/60 mmHg  Pulse 64  Temp(Src) 99 F (37.2 C)  Ht  (1.626 m)  Wt 193 lb 12.8 oz (87.907 kg)  BMI 33.25 kg/m2  SpO2 88%   General Appearance: pleasant, well built and nourished         Eyes: No abnormal prominence or eyelid swelling.          Neck: The thyroid is palpable on swallowing on the right about twice normal.  Left side is not clearly palpable and no discrete nodules felt There is no stridor. Pemberton sign is negative There is no lymphadenopathy.     Cardiovascular: Normal  heart sounds, no murmur Neurological: REFLEXES: at biceps are normal.  Skin: no rash.  Has no peripheral edema         Assessment/Plan:  Multinodular goiter, long-standing She has increase in size of the left-sided nodules since 2002 However recent ultrasound does not clearly indicate whether she has 3 separate nodules or 1 nodule 1.8 cm No microcalcifications or other suspicious characteristics described Right-sided nodule is smaller than in 2002 and this was biopsied  Discussed with patient that she does have a long-standing multinodular goiter and there is no clear indication for doing a biopsy on the left side at this time Given the low chances of a malignant process based on the current information will elect to repeat her ultrasound in November  She will also follow-up with her PCP for other problems that are unrelated to the thyroid    [facility] 01/19/2016

## 2016-11-17 ENCOUNTER — Telehealth: Payer: Self-pay | Admitting: Endocrinology

## 2016-11-17 NOTE — Telephone Encounter (Signed)
-----   Message from Reather LittlerAjay Kumar, MD sent at 11/06/2016  8:14 AM EST ----- Regarding:   Follow-up of thyroid,Nonurgent I am planning to order a repeat ultrasound of her thyroid and she will need to be seen in follow-up in the office also, if agreeable with her will order   ----- Message ----- From: Reather LittlerAjay Kumar, MD Sent: 10/31/2016 To: Reather LittlerAjay Kumar, MD Subject: Ultrasound of thyroid

## 2016-11-17 NOTE — Telephone Encounter (Signed)
Pt is asking us to postpone this discussion until January after her work is not so busy.

## 2016-12-19 ENCOUNTER — Telehealth: Payer: Self-pay | Admitting: Internal Medicine

## 2016-12-21 NOTE — Telephone Encounter (Signed)
rx sent for 30 day supply.  Please let pt know she needs OV prior to additional refills.

## 2016-12-26 NOTE — Telephone Encounter (Signed)
Please attempt to reach pt tomorrow and if no success I will send pt a letter. Thanks!

## 2016-12-26 NOTE — Telephone Encounter (Signed)
LVM for pt to call our office back for scheduling.

## 2017-01-01 NOTE — Telephone Encounter (Signed)
Tried calling pt again. [place]. She says that she will call us back when in town to scheduled. Pt says that she is also out of her losartan and pravastatin. Pt would like to be advised, is it okay that she isnt taking? If not she would like to know if provider could provide her with a supply until? She would like a call back to be advised. If provider can refill 2 medications she will be looking into a pharmacy to have them sent to her in [place].    Pt says that it is okay to lvm if need to.

## 2017-01-01 NOTE — Telephone Encounter (Signed)
Left message on pt's voicemail that I will send 30 day supply to pharmacy if she will call with pharmacy contact info and we will need to see her ASAP since she has not been seen by PCP since 11/2015 and has not formally established with Melissa yet.

## 2017-01-17 ENCOUNTER — Other Ambulatory Visit: Payer: Self-pay | Admitting: Family

## 2017-01-19 NOTE — Telephone Encounter (Signed)
Denial sent to pharmacy as pt has not been seen by PCP (Dr Felicity CoyerLeschber) since 2016 and she needs to establish with new PCP for further refills. Pt was advised of this per 12/19/16 phone note.

## 2017-02-12 IMAGING — US US SOFT TISSUE HEAD/NECK
1 series · 13 of 25 positions shown · non-contrast
Comparison: None.

CLINICAL DATA: 54-year-old female with a history of goiter. Prior
right-sided thyroid biopsy performed 9889

EXAM:
THYROID ULTRASOUND
TECHNIQUE: Ultrasound examination of the thyroid gland and adjacent soft
tissues was performed.

[Series 1: us soft tissue head/neck · 0.06mm/px · 13 of 65 slices shown]
[im 1/65]
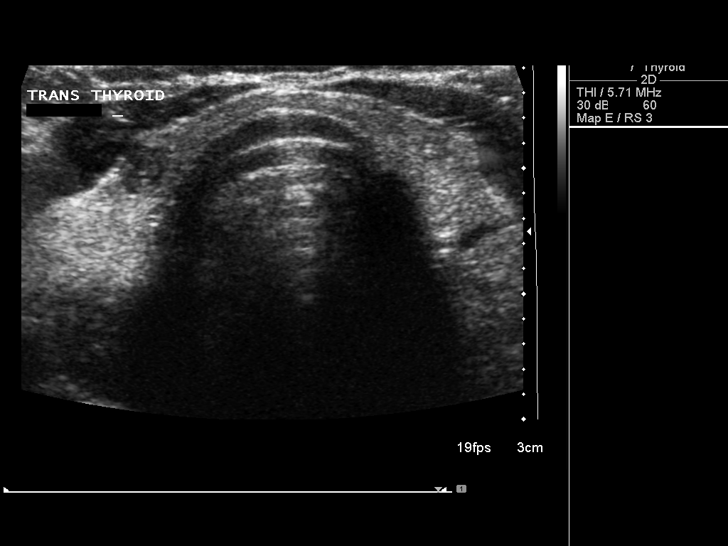
[im 6/65]
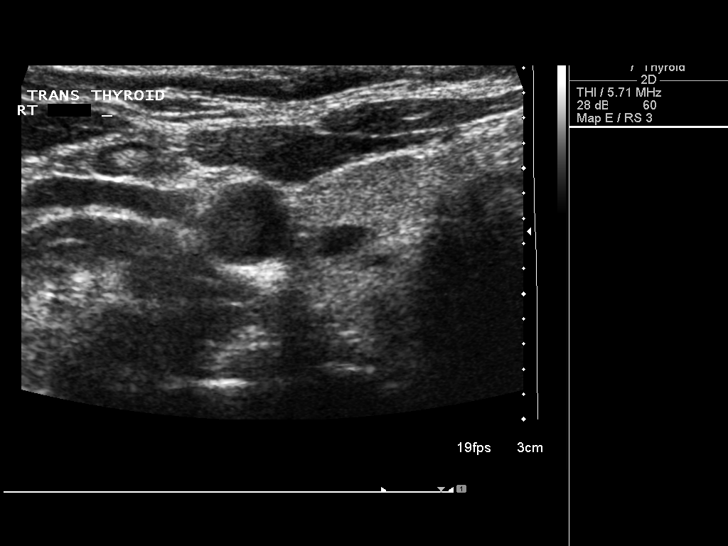
[im 11/65]
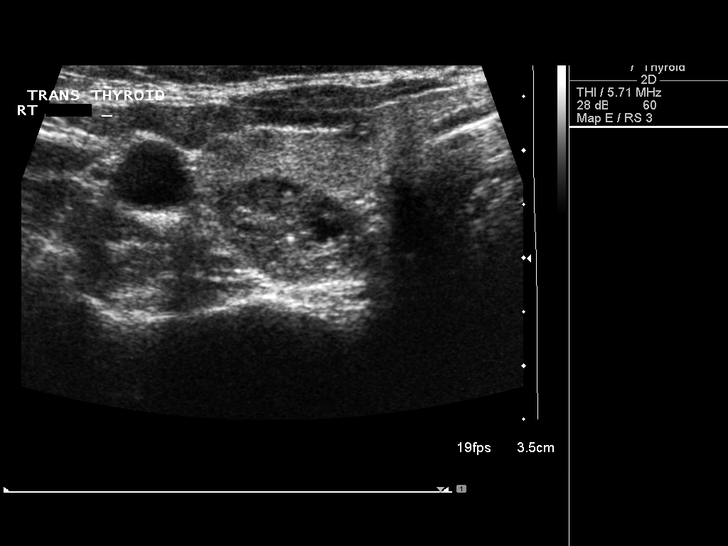
[im 17/65]
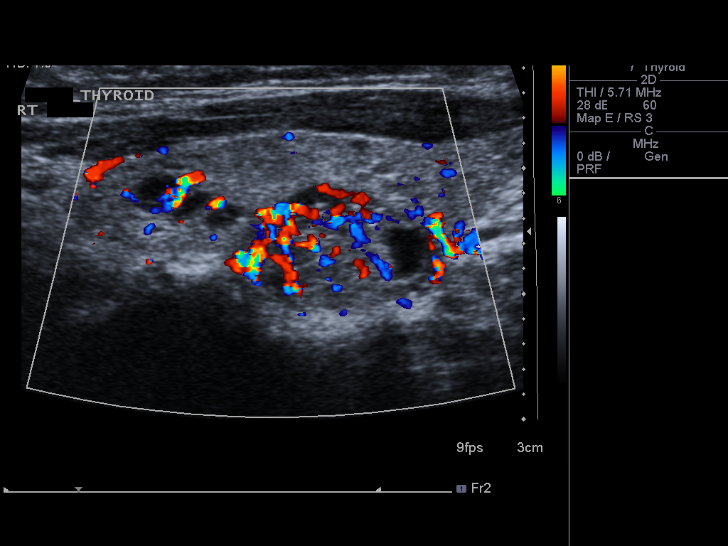
[im 22/65]
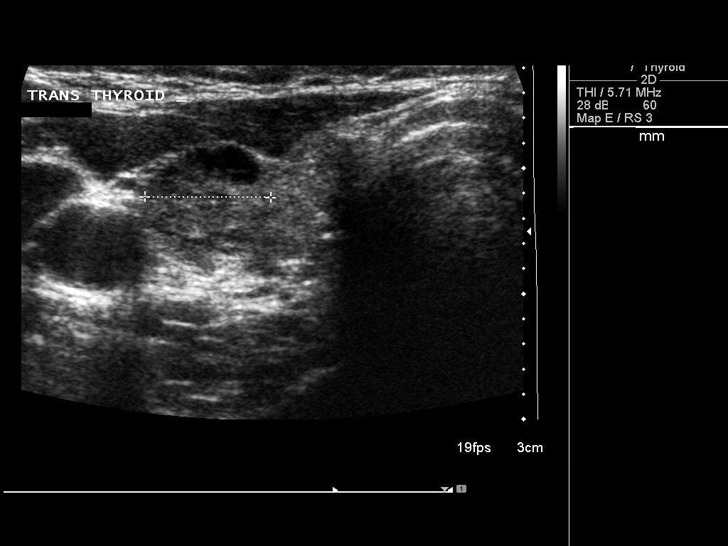
[im 27/65]
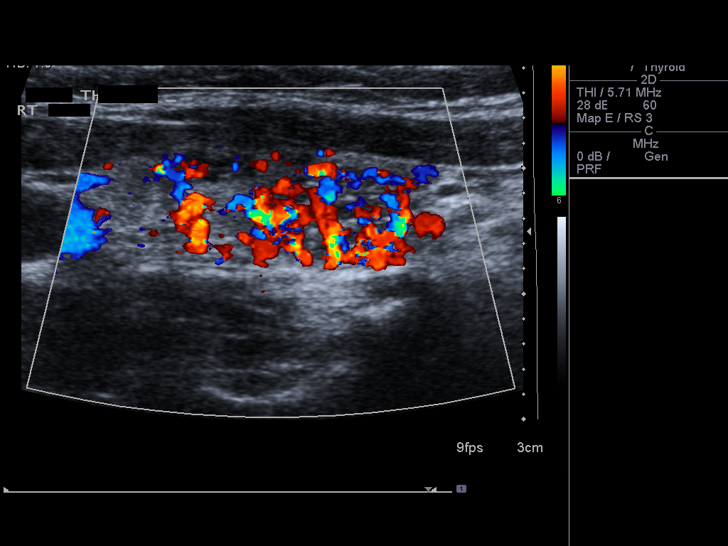
[im 33/65]
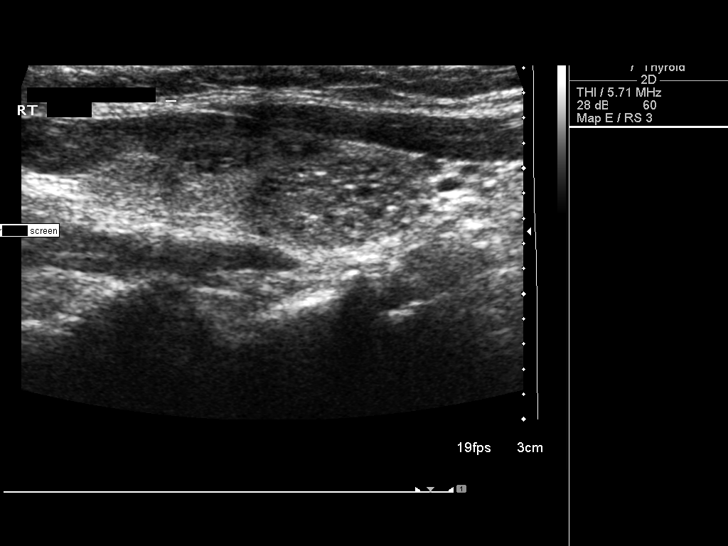
[im 38/65]
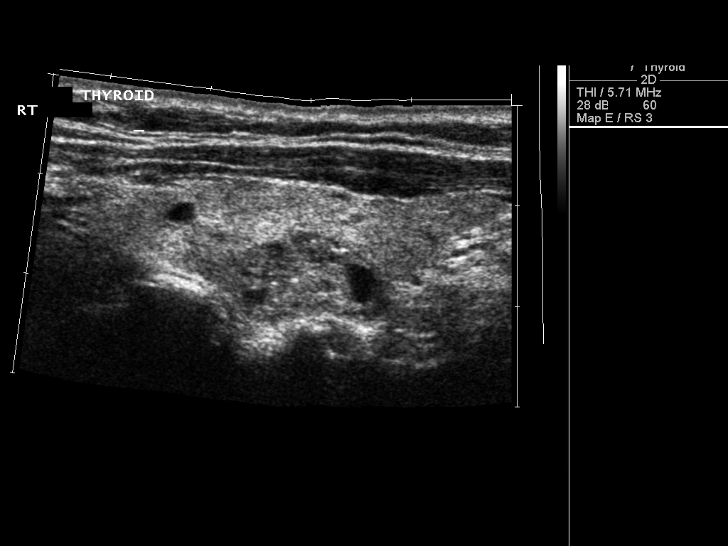
[im 43/65]
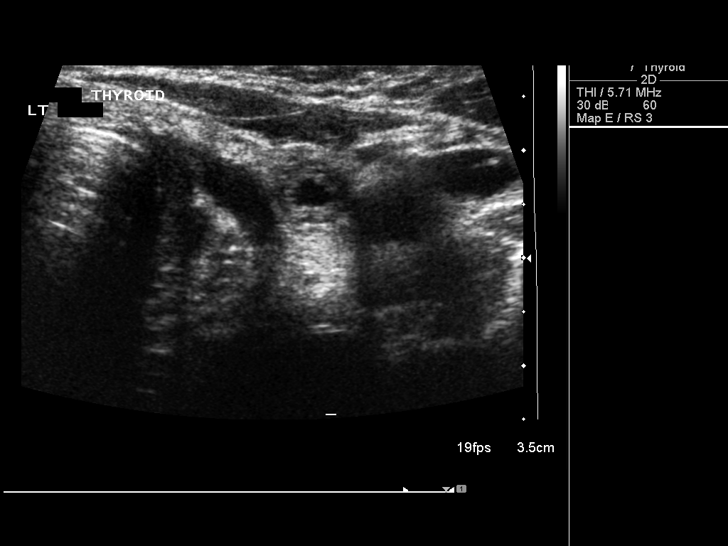
[im 49/65]
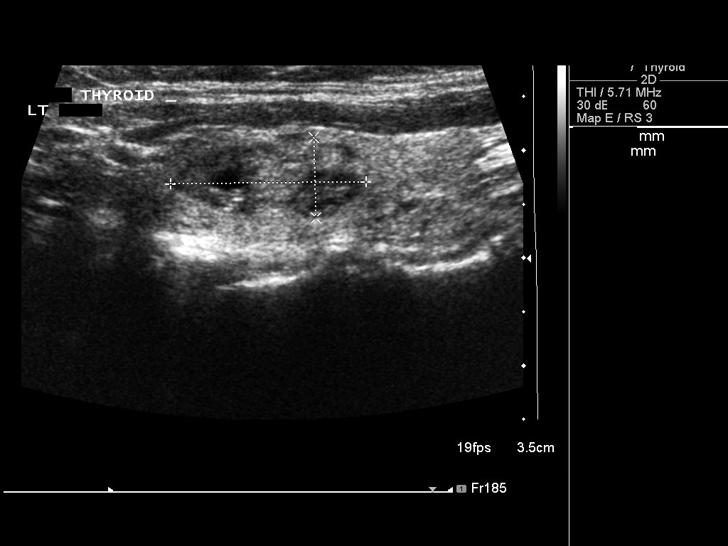
[im 54/65]
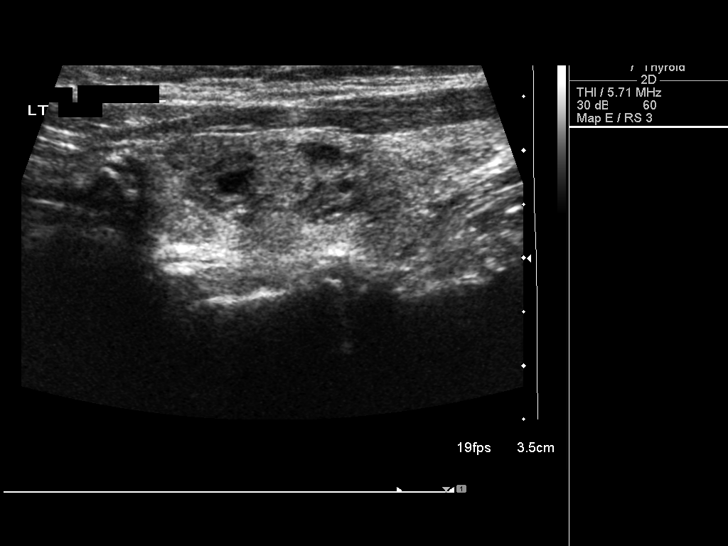
[im 59/65]
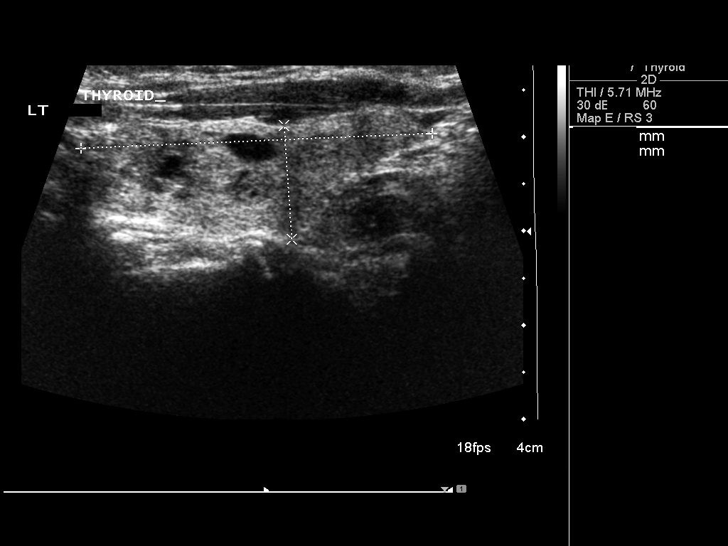
[im 65/65]
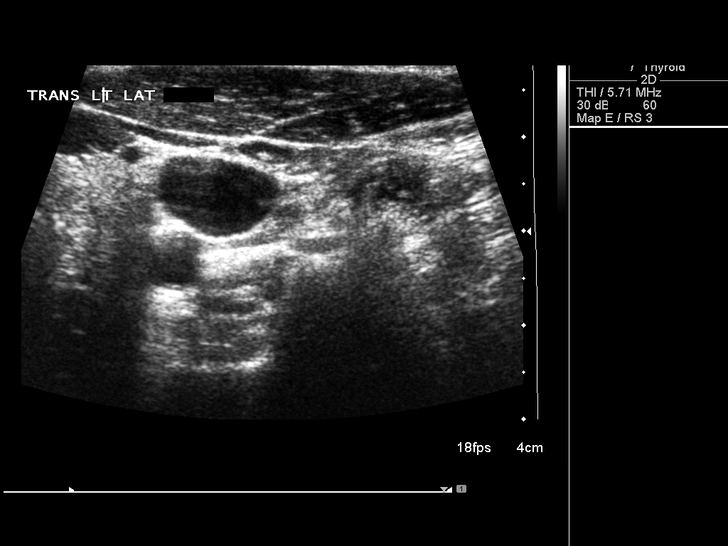

[13 of 25 positions shown; findings below may reference images not displayed]

FINDINGS: Right thyroid lobe

Measurements: 4.2 cm x 1.4 cm x 1.7 cm. Multiple right-sided thyroid
nodules. Majority of which measure less than 4 mm.

Dominant nodule inferior measuring 1.5 cm x 1.0 cm x 1.4 cm.
Internal reflectors compatible with calcium or colloid.

Adjacent nodule measures 1.2 cm x 8 mm x 1.0 cm with internal
colloid or calcium. Uncertain if these are separate nodules or part
of the same process.

Left thyroid lobe

Measurements: 3.7 cm x 1.2 cm x 1.6 cm. Adjacent nodules on the
left, which may be part of the same nodule or 3 separate nodules.
Greatest diameter is 1.8 cm x 8 mm x 1.0 cm.

Isthmus

Thickness: 3 mm.  No nodules visualized.

Lymphadenopathy

None visualized.
IMPRESSION: Multinodular thyroid. Both left-sided and right-sided nodules meet
criteria for biopsy, should these regions in fact be focal nodules
and not multiple adjacent nodules.

Findings meet consensus criteria for biopsy. Ultrasound-guided fine
needle aspiration should be considered, as per the consensus
statement: Management of Thyroid Nodules Detected at US: Society of
Radiologists in Ultrasound Consensus Conference Statement. Radiology
5993; [DATE].

## 2017-07-16 ENCOUNTER — Encounter: Payer: Self-pay | Admitting: Internal Medicine

## 2017-07-16 NOTE — Telephone Encounter (Signed)
error:[id number] ° °

## 2017-07-26 ENCOUNTER — Ambulatory Visit: Payer: 59 | Admitting: Family Medicine

## 2017-07-30 ENCOUNTER — Encounter: Payer: Self-pay | Admitting: Family Medicine

## 2017-07-30 ENCOUNTER — Ambulatory Visit (INDEPENDENT_AMBULATORY_CARE_PROVIDER_SITE_OTHER): Payer: 59 | Admitting: Family Medicine

## 2017-07-30 VITALS — BP 120/76 | HR 59 | Temp 97.6°F | Ht 64.5 in | Wt 178.5 lb

## 2017-07-30 DIAGNOSIS — E785 Hyperlipidemia, unspecified: Secondary | ICD-10-CM | POA: Diagnosis not present

## 2017-07-30 DIAGNOSIS — I10 Essential (primary) hypertension: Secondary | ICD-10-CM

## 2017-07-30 DIAGNOSIS — L659 Nonscarring hair loss, unspecified: Secondary | ICD-10-CM

## 2017-07-30 MED ORDER — HYDROCHLOROTHIAZIDE 12.5 MG PO TABS: 12.5000 mg | ORAL_TABLET | Freq: Every day | ORAL | 5 refills | Status: DC

## 2017-07-30 NOTE — Patient Instructions (Signed)
Give [place] 2-3 business days to get the results of your labs back. If labs are normal, you will likely receive a letter in the mail unless you have MyChart. This can take longer than 2-3 business days. I will let you know if you need to go back on pravastatin and if you should stay on aspirin.  Let us know if you need anything.

## 2017-07-30 NOTE — Progress Notes (Signed)
Pre visit review using our clinic review tool, if applicable. No additional management support is needed unless otherwise documented below in the visit note. 

## 2017-07-30 NOTE — Progress Notes (Signed)
Chief Complaint  Patient presents with  . Establish Care       New Patient Visit SUBJECTIVE: HPI: [name] [name] is an 56 y.o.female who is being seen for establishing care.  The patient was previously seen at [facility].  Hypertension Patient presents for hypertension discussion. She does not routinely monitor home blood pressures. She is compliant with medications- HCTZ25 mg daily. Patient has these side effects of medication: none She is adhering to a healthy diet overall. Fast metabolism diet. Exercise: physically active at work  Dyslipidemia Patient presents for dyslipidemia follow up. Compliance with treatment thus far has been good. She denies myalgias. She is adhering to a low sodium and low fat diet. The patient exercises never.  The patient is not known to have coexisting coronary artery disease.  Over the past several months, the patient has been having hair thinning. Her hairdresser stated that she should have her thyroid checked. She's not having unexpected weight loss. Her parents and grandparents have good heads of hair. She is not having patches of hair loss. No new hair products. She does not pull her hair tight.  Allergies  Allergen Reactions  . Codeine     REACTION: makes pt sick    Past Medical History:  Diagnosis Date  . ANXIETY   . ARTHRITIS   . ASTHMA   . DEGENERATIVE JOINT DISEASE   . EMPHYSEMA, MILD   . GALLBLADDER DISEASE, HX OF   . HYPERLIPIDEMIA   . HYPERTENSION   . Multinodular goiter 05/2001 dx   s/p bx: benign  . TOBACCO ABUSE    hx of; quit 1/22016  . Vitamin D deficiency    Past Surgical History:  Procedure Laterality Date  . CHOLECYSTECTOMY  2008  . JOINT REPLACEMENT Right 1998   total hip  . thyroid biopsy  2002   Social History   Social History  . Marital status: Divorced   Social History Main Topics  . Smoking status: Former Smoker    Quit date: 12/11/2014  . Smokeless tobacco: Never Used  . Alcohol use No  . Drug  use: No  . 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 Alcohol abuse Father 33       sober since age 78  . Arthritis Father   . Lung cancer Paternal Grandmother   . Hypertension Mother   . Hyperlipidemia Mother   . Hypertension Father   . Hyperlipidemia Father   . Rectal cancer Maternal Grandmother   . Stroke Mother 29       L HP  . Lung cancer Father 50       smoker     Current Outpatient Prescriptions:  .  aspirin 81 MG tablet, Take 81 mg by mouth daily.  , Disp: , Rfl:  .  hydrochlorothiazide (HYDRODIURIL) 12.5 MG tablet, Take 1 tablet (12.5 mg total) by mouth daily., Disp: 30 tablet, Rfl: 5 .  Multiple Vitamin (MULTIVITAMIN) tablet, Take 1 tablet by mouth daily.  , Disp: , Rfl:   No LMP recorded. Patient is postmenopausal.  ROS Cardiovascular: Denies chest pain  Respiratory: Denies dyspnea   OBJECTIVE: BP 120/76 (BP Location: Left Arm, Patient Position: Sitting, Cuff Size: Normal)   Pulse (!) 59   Temp 97.6 F (36.4 C) (  Oral)   Ht 5' 4.5" (1.638 m)   Wt 178 lb 8 oz (81 kg)   SpO2 96%   BMI 30.17 kg/m   Constitutional: -  VS reviewed -  Well developed, well nourished, appears stated age -  No apparent distress  Psychiatric: -  Oriented to person, place, and time -  Memory intact -  Affect and mood normal -  Fluent conversation, good eye contact -  Judgment and insight age appropriate  Eye: -  Conjunctivae clear, no discharge -  Pupils symmetric, round, reactive to light  ENMT: -  MMM    Pharynx moist, no exudate, no erythema  Neck: -  No gross swelling, no palpable masses -  Thyroid midline, not enlarged, mobile, no palpable masses  Cardiovascular: -  RRR -  No LE edema  Respiratory: -  Normal respiratory effort, no accessory muscle use, no  retraction -  Breath sounds equal, no wheezes, no ronchi, no crackles  Musculoskeletal: -  No clubbing, no cyanosis -  Gait normal  Skin: -  No significant lesion on inspection -  Some thinning of hair without follicular abn or patches of alopecia -  Warm and dry to palpation   ASSESSMENT/PLAN: Essential hypertension - Plan: Comprehensive metabolic panel, hydrochlorothiazide (HYDRODIURIL) 12.5 MG tablet  Thinning hair - Plan: CBC, Ferritin, TSH, IBC panel  Dyslipidemia - Plan: Lipid panel  OK to stop losartan, cont HCTZ at above dose.  Hold pravastatin until labs return. Check other labs for hair thinning. Patient should return at earliest convenience for CPE. The patient voiced understanding and agreement to the plan.   Jilda Roche [place], DO 07/30/17  4:41 PM

## 2017-07-31 LAB — LIPID PANEL
CHOLESTEROL: 194 mg/dL (ref 0–200)
HDL: 44 mg/dL (ref >39.00)
NonHDL: 150.43
TRIGLYCERIDES: 225 mg/dL — AB (ref 0.0–149.0)
Total CHOL/HDL Ratio: 4
VLDL: 45 mg/dL — ABNORMAL HIGH (ref 0.0–40.0)

## 2017-07-31 LAB — COMPREHENSIVE METABOLIC PANEL
ALBUMIN: 3.7 g/dL (ref 3.5–5.2)
ALK PHOS: 70 U/L (ref 39–117)
ALT: 13 U/L (ref 0–35)
AST: 14 U/L (ref 0–37)
BUN: 13 mg/dL (ref 6–23)
CO2: 36 mEq/L — ABNORMAL HIGH (ref 19–32)
Calcium: 8.9 mg/dL (ref 8.4–10.5)
Chloride: 101 mEq/L (ref 96–112)
Creatinine, Ser: 0.78 mg/dL (ref 0.40–1.20)
GFR: 80.94 mL/min (ref >60.00)
Glucose, Bld: 86 mg/dL (ref 70–99)
POTASSIUM: 3.6 meq/L (ref 3.5–5.1)
Sodium: 141 mEq/L (ref 135–145)
TOTAL PROTEIN: 6.3 g/dL (ref 6.0–8.3)
Total Bilirubin: 0.5 mg/dL (ref 0.2–1.2)

## 2017-07-31 LAB — CBC
HEMATOCRIT: 38.7 % (ref 36.0–46.0)
HEMOGLOBIN: 13.1 g/dL (ref 12.0–15.0)
MCHC: 33.8 g/dL (ref 30.0–36.0)
MCV: 91.2 fl (ref 78.0–100.0)
Platelets: 220 10*3/uL (ref 150.0–400.0)
RBC: 4.25 Mil/uL (ref 3.87–5.11)
RDW: 13.6 % (ref 11.5–15.5)
WBC: 5.8 10*3/uL (ref 4.0–10.5)

## 2017-07-31 LAB — FERRITIN: FERRITIN: 55.1 ng/mL (ref 10.0–291.0)

## 2017-07-31 LAB — IBC PANEL
Iron: 88 ug/dL (ref 42–145)
SATURATION RATIOS: 24.5 % (ref 20.0–50.0)
Transferrin: 257 mg/dL (ref 212.0–360.0)

## 2017-07-31 LAB — LDL CHOLESTEROL, DIRECT: LDL DIRECT: 126 mg/dL

## 2017-07-31 LAB — TSH: TSH: 0.78 u[IU]/mL (ref 0.35–4.50)

## 2017-12-05 ENCOUNTER — Other Ambulatory Visit: Payer: Self-pay | Admitting: Family Medicine

## 2017-12-05 DIAGNOSIS — I10 Essential (primary) hypertension: Secondary | ICD-10-CM

## 2017-12-20 ENCOUNTER — Encounter: Payer: Self-pay | Admitting: Internal Medicine

## 2017-12-20 ENCOUNTER — Ambulatory Visit: Payer: 59 | Admitting: Internal Medicine

## 2017-12-20 VITALS — BP 114/64 | HR 78 | Ht 64.5 in | Wt 189.8 lb

## 2017-12-20 DIAGNOSIS — I10 Essential (primary) hypertension: Secondary | ICD-10-CM | POA: Diagnosis not present

## 2017-12-20 DIAGNOSIS — R072 Precordial pain: Secondary | ICD-10-CM

## 2017-12-20 DIAGNOSIS — R011 Cardiac murmur, unspecified: Secondary | ICD-10-CM

## 2017-12-20 NOTE — Progress Notes (Signed)
Cardiology Office Note   Date:  12/20/2017   ID:  [name] [name], DOB 08/16/1961, MRN [id number]  PCP:  Sharlene DoryWendling, Nicholas Paul, DO  Cardiologist:   Dietrich PatesPaula Ross, MD   Pt referred by Dr Arelia SneddonMcComb for CP   History of Present Illness: [name] [name] is a 57 y.o. female with a history of chest pain, HL, HTN, tobacco use.  I saw her in 2011  Chest fain fet probrobly GI in past    Over XMas was in [place]  Had Jabbing in chest  Some arm weakness  Indigestion  Panicked Episodes of tightness happened every day  Not constant  Come and go  Achy arm stayed  Jabbiness come/go   INdigestion got worse  Fluttering then go away  No dizziess Went to Urgent Care in [place] after Xmas  EKG OK    Said f/u to cardiology   Since home has intermitt jabbing  Notes a lot of indigestion   Has drainaage in throat  Throat is sore   Current Meds  Medication Sig  . aspirin 81 MG tablet Take 81 mg by mouth daily.    . hydrochlorothiazide (HYDRODIURIL) 12.5 MG tablet TAKE 1 TABLET BY MOUTH EVERY DAY  . LOSARTAN POTASSIUM PO Take by mouth daily.  . Multiple Vitamin (MULTIVITAMIN) tablet Take 1 tablet by mouth daily.    Marland Kitchen. PRAVASTATIN SODIUM PO Take by mouth daily.     Allergies:   Codeine   Past Medical History:  Diagnosis Date  . ANXIETY   . ARTHRITIS   . ASTHMA   . DEGENERATIVE JOINT DISEASE   . EMPHYSEMA, MILD   . GALLBLADDER DISEASE, HX OF   . HYPERLIPIDEMIA   . HYPERTENSION   . Multinodular goiter 05/2001 dx   s/p bx: benign  . TOBACCO ABUSE    hx of; quit 1/22016  . Vitamin D deficiency     Past Surgical History:  Procedure Laterality Date  . CHOLECYSTECTOMY  2008  . JOINT REPLACEMENT Right 1998   total hip  . thyroid biopsy  2002     Social History:  The patient  reports that she quit smoking about 3 years ago. Her smoking use included cigarettes. She has a 15.00 pack-year smoking history. she has never used smokeless tobacco. She reports that she does not drink alcohol or use drugs.   Family  History:  The patient's family history includes Alcohol abuse (age of onset: 2152) in her father; Arthritis in her father; Hyperlipidemia in her father and mother; Hypertension in her father and mother; Lung cancer in her paternal grandmother; Lung cancer (age of onset: 6479) in her father; Rectal cancer in her maternal grandmother; Stroke (age of onset: 2179) in her mother.    ROS:  Please see the history of present illness. All other systems are reviewed and  Negative to the above problem except as noted.    PHYSICAL EXAM: VS:  BP 114/64   Pulse 78   Ht 5' 4.5" (1.638 m)   Wt 189 lb 12.8 oz (86.1 kg)   SpO2 96%   BMI 32.08 kg/m   GEN: Well nourished, well developed, in no acute distress  HEENT: normal  Neck: no JVD, carotid bruits, or masses Cardiac: RRR; Gr II/VI systolic murmur at base  No, rubs, or gallops,no edema  Chest  Nontender  Respiratory:  clear to auscultation bilaterally, normal work of breathing GI: soft, nontender, nondistended, + BS  No hepatomegaly  MS: no deformity Moving all  extremities   Skin: warm and dry, no rash Neuro:  Strength and sensation are intact Psych: euthymic mood, full affect   EKG:  EKG is Not availabe  In urgent care told it was OK   Lipid Panel    Component Value Date/Time   CHOL 194 07/30/2017 1548   TRIG 225.0 (H) 07/30/2017 1548   TRIG 199 10/13/2009   HDL 44.00 07/30/2017 1548   CHOLHDL 4 07/30/2017 1548   VLDL 45.0 (H) 07/30/2017 1548   LDLCALC 87 03/03/2013 1629   LDLCALC 188 10/13/2009   LDLDIRECT 126.0 07/30/2017 1548      Wt Readings from Last 3 Encounters:  12/20/17 189 lb 12.8 oz (86.1 kg)  07/30/17 178 lb 8 oz (81 kg)  01/19/16 193 lb 12.8 oz (87.9 kg)      ASSESSMENT AND PLAN:  1  Chest pain  Atypical  I am not convinced cardiac  Would set up for  Caldcium score CT      2  Murmur  Will set up for echo to evaluate    2  HTN  Good control    3  HL     Current medicines are reviewed at length with the patient  today.  The patient does not have concerns regarding medicines.  Signed, Dietrich Pates, MD  12/20/2017 12:18 PM    [facility] [address] [place], [place], [place]  [address] Phone: [phone]; Fax: [phone]

## 2017-12-20 NOTE — Patient Instructions (Signed)
Your physician recommends that you continue on your current medications as directed. Please refer to the Current Medication list given to you today. Your physician has requested that you have an echocardiogram. Echocardiography is a painless test that uses sound waves to create images of your heart. It provides your doctor with information about the size and shape of your heart and how well your heart's chambers and valves are working. This procedure takes approximately one hour. There are no restrictions for this procedure.  Echo can wait until you return from your work trip  Please schedule calcium score ct scan.  $150 out of pocket cost.

## 2017-12-21 ENCOUNTER — Ambulatory Visit: Payer: 59 | Admitting: Nurse Practitioner

## 2017-12-21 ENCOUNTER — Other Ambulatory Visit: Payer: Self-pay | Admitting: *Deleted

## 2017-12-21 ENCOUNTER — Encounter: Payer: Self-pay | Admitting: Nurse Practitioner

## 2017-12-21 ENCOUNTER — Telehealth: Payer: Self-pay

## 2017-12-21 ENCOUNTER — Ambulatory Visit (INDEPENDENT_AMBULATORY_CARE_PROVIDER_SITE_OTHER)
Admission: RE | Admit: 2017-12-21 | Discharge: 2017-12-21 | Disposition: A | Discharge status: Home / Self Care | Payer: Self-pay | Source: Ambulatory Visit | Attending: Internal Medicine | Admitting: Internal Medicine

## 2017-12-21 ENCOUNTER — Telehealth: Payer: Self-pay | Admitting: *Deleted

## 2017-12-21 VITALS — BP 120/86 | HR 82 | Temp 98.3°F | Ht 64.5 in | Wt 190.0 lb

## 2017-12-21 DIAGNOSIS — S99922A Unspecified injury of left foot, initial encounter: Secondary | ICD-10-CM | POA: Diagnosis not present

## 2017-12-21 DIAGNOSIS — I10 Essential (primary) hypertension: Secondary | ICD-10-CM

## 2017-12-21 DIAGNOSIS — J069 Acute upper respiratory infection, unspecified: Secondary | ICD-10-CM

## 2017-12-21 DIAGNOSIS — R011 Cardiac murmur, unspecified: Secondary | ICD-10-CM

## 2017-12-21 MED ORDER — PRAVASTATIN SODIUM 20 MG PO TABS: 20.0000 mg | ORAL_TABLET | Freq: Every evening | ORAL | 11 refills | Status: DC

## 2017-12-21 MED ORDER — METHYLPREDNISOLONE ACETATE 40 MG/ML IJ SUSP
40.0000 mg | Freq: Once | INTRAMUSCULAR | Status: AC
  Administered 2017-12-21: 40 mg via INTRAMUSCULAR

## 2017-12-21 MED ORDER — DM-GUAIFENESIN ER 30-600 MG PO TB12: 1.0000 | ORAL_TABLET | Freq: Two times a day (BID) | ORAL | 0 refills | Status: DC | PRN

## 2017-12-21 MED ORDER — FLUTICASONE PROPIONATE 50 MCG/ACT NA SUSP: 2.0000 | Freq: Every day | NASAL | 0 refills | Status: DC

## 2017-12-21 MED ORDER — HYDROCODONE-HOMATROPINE 5-1.5 MG/5ML PO SYRP: 5.0000 mL | ORAL_SOLUTION | Freq: Two times a day (BID) | ORAL | 0 refills | Status: DC | PRN

## 2017-12-21 MED ORDER — LOSARTAN POTASSIUM 50 MG PO TABS: 50.0000 mg | ORAL_TABLET | Freq: Every day | ORAL | 11 refills | Status: DC

## 2017-12-21 MED ORDER — OXYMETAZOLINE HCL 0.05 % NA SOLN: 1.0000 | Freq: Two times a day (BID) | NASAL | 0 refills | Status: DC

## 2017-12-21 MED ORDER — HYDROCHLOROTHIAZIDE 12.5 MG PO TABS: 12.5000 mg | ORAL_TABLET | Freq: Every day | ORAL | 11 refills | Status: DC

## 2017-12-21 NOTE — Telephone Encounter (Signed)
Please advise 

## 2017-12-21 NOTE — Patient Instructions (Addendum)
URI Instructions: Flonase and Afrin use: apply 1spray of afrin in each nare, wait , then apply 2sprays of flonase in each nare. Use both nasal spray consecutively x 3days, then flonase only for at least 14days.  Encourage adequate oral hydration.  Use over-the-counter  "cold" medicines  such as "Tylenol cold" , "Advil cold",  "Mucinex" or" Mucinex D"  for cough and congestion.  Avoid decongestants if you have high blood pressure. Use" Delsym" or" Robitussin" cough syrup varietis for cough.  You can use plain "Tylenol" or "Advi"l for fever, chills and achyness.   "Common cold" symptoms are usually triggered by a virus.  The antibiotics are usually not necessary. On average, a" viral cold" illness would take 4-7 days to resolve. Please, make an appointment if you are not better or if you're worse.  patient decline toe x-ray today. She stated if no improvement, she will return to office another time.

## 2017-12-21 NOTE — Telephone Encounter (Signed)
Patient called and stated that she was seen yesterday and was told that refills on her medications would be sent to the pharmacy. She went to pick them up and the pharmacy informed her that they did not receive anything from our office. She does not know her dose on losartan or pravastatin and the doses and sig were not put in epic but she will call us back with this information when she gets home. They are about to go out of town and she would like these to be sent in asap. Should I call the pharmacy and request her refill history and refill accordingly? Please advise. Thanks, MI

## 2017-12-21 NOTE — Telephone Encounter (Signed)
OK w me.  

## 2017-12-21 NOTE — Telephone Encounter (Signed)
See below, Thank you

## 2017-12-21 NOTE — Telephone Encounter (Signed)
Yes please check with pharmacy on dosages for pravastatin, hydrochlorothiaze, losartan and refill these.

## 2017-12-21 NOTE — Addendum Note (Signed)
Addended by: Valrie HartISLEY, Diania Co L on: 12/21/2017 04:31 PM   Modules accepted: Orders

## 2017-12-21 NOTE — Telephone Encounter (Signed)
Spoke with pharmacist and verified dose and sig on medications.  I will send these in via escribe. Patient has been made aware.

## 2017-12-21 NOTE — Addendum Note (Signed)
Addended by: Valrie HartISLEY, Rayne Loiseau L on: 12/21/2017 04:34 PM   Modules accepted: Orders

## 2017-12-21 NOTE — Addendum Note (Signed)
Addended by: Valrie HartISLEY, Quiera Diffee L on: 12/21/2017 04:30 PM   Modules accepted: Orders

## 2017-12-21 NOTE — Progress Notes (Signed)
Subjective:  Patient ID: [name] [name], female    DOB: 28-Jun-1961  Age: 57 y.o. MRN: [id number]  CC: Sore Throat (sore throat,chest congestion,drainage/3 days. ) and Toe Injury (left pinky toe injury--not sure if she broke it 1 wk ago--getting better but want to make sure it is ok?)   URI   This is a new problem. The current episode started in the past 7 days. The problem has been unchanged. There has been no fever. Associated symptoms include congestion, coughing, ear pain, headaches, a plugged ear sensation, rhinorrhea, sinus pain, sneezing and a sore throat. Pertinent negatives include no wheezing. She has tried decongestant, acetaminophen and NSAIDs for the symptoms. The treatment provided mild relief.  Toe Pain   The incident occurred at work. The injury mechanism was a direct blow. The pain is present in the left toes. The quality of the pain is described as aching. The pain is moderate. The pain has been fluctuating since onset. Pertinent negatives include no inability to bear weight, muscle weakness, numbness or tingling. The symptoms are aggravated by weight bearing, palpation and movement. She has tried ice, elevation and rest for the symptoms. The treatment provided moderate relief.   Outpatient Medications Prior to Visit  Medication Sig Dispense Refill  . aspirin 81 MG tablet Take 81 mg by mouth daily.      . hydrochlorothiazide (HYDRODIURIL) 12.5 MG tablet TAKE 1 TABLET BY MOUTH EVERY DAY 30 tablet 5  . LOSARTAN POTASSIUM PO Take by mouth daily.    . Multiple Vitamin (MULTIVITAMIN) tablet Take 1 tablet by mouth daily.      Marland Kitchen. PRAVASTATIN SODIUM PO Take by mouth daily.     No facility-administered medications prior to visit.     ROS See HPI  Objective:  BP 120/86   Pulse 82   Temp 98.3 F (36.8 C) (Oral)   Ht 5' 4.5" (1.638 m)   Wt 190 lb (86.2 kg)   SpO2 97%   BMI 32.11 kg/m   BP Readings from Last 3 Encounters:  12/21/17 120/86  12/20/17 114/64  07/30/17 120/76     Wt Readings from Last 3 Encounters:  12/21/17 190 lb (86.2 kg)  12/20/17 189 lb 12.8 oz (86.1 kg)  07/30/17 178 lb 8 oz (81 kg)    Physical Exam  Constitutional: She is oriented to person, place, and time.  HENT:  Right Ear: Tympanic membrane, external ear and ear canal normal.  Left Ear: Tympanic membrane, external ear and ear canal normal.  Nose: Mucosal edema and rhinorrhea present. Right sinus exhibits maxillary sinus tenderness. Right sinus exhibits no frontal sinus tenderness. Left sinus exhibits maxillary sinus tenderness. Left sinus exhibits no frontal sinus tenderness.  Mouth/Throat: Uvula is midline. No trismus in the jaw. Posterior oropharyngeal erythema present. No oropharyngeal exudate.  Eyes: No scleral icterus.  Neck: Normal range of motion. Neck supple.  Cardiovascular: Normal rate and normal heart sounds.  Pulmonary/Chest: Effort normal and breath sounds normal.  Musculoskeletal: She exhibits no edema.       Right foot: There is tenderness, bony tenderness and swelling. There is no crepitus, no deformity and no laceration.       Feet:  Lymphadenopathy:    She has no cervical adenopathy.  Neurological: She is alert and oriented to person, place, and time.  Skin: No erythema.  Psychiatric: She has a normal mood and affect. Her behavior is normal.  Vitals reviewed.  Lab Results  Component Value Date   WBC 5.8  07/30/2017   HGB 13.1 07/30/2017   HCT 38.7 07/30/2017   PLT 220.0 07/30/2017   GLUCOSE 86 07/30/2017   CHOL 194 07/30/2017   TRIG 225.0 (H) 07/30/2017   HDL 44.00 07/30/2017   LDLDIRECT 126.0 07/30/2017   LDLCALC 87 03/03/2013   ALT 13 07/30/2017   AST 14 07/30/2017   NA 141 07/30/2017   K 3.6 07/30/2017   CL 101 07/30/2017   CREATININE 0.78 07/30/2017   BUN 13 07/30/2017   CO2 36 (H) 07/30/2017   TSH 0.78 07/30/2017   INR 1.0 07/01/2007   HGBA1C 5.7 10/09/2011    US Soft Tissue Head/neck  Result Date: 12/07/2015 CLINICAL DATA:   57 year old female with a history of goiter. Prior right-sided thyroid biopsy performed 2002 EXAM: THYROID ULTRASOUND TECHNIQUE: Ultrasound examination of the thyroid gland and adjacent soft tissues was performed. COMPARISON:  None. FINDINGS: Right thyroid lobe Measurements: 4.2 cm x 1.4 cm x 1.7 cm. Multiple right-sided thyroid nodules. Majority of which measure less than 4 mm. Dominant nodule inferior measuring 1.5 cm x 1.0 cm x 1.4 cm. Internal reflectors compatible with calcium or colloid. Adjacent nodule measures 1.2 cm x 8 mm x 1.0 cm with internal colloid or calcium. Uncertain if these are separate nodules or part of the same process. Left thyroid lobe Measurements: 3.7 cm x 1.2 cm x 1.6 cm. Adjacent nodules on the left, which may be part of the same nodule or 3 separate nodules. Greatest diameter is 1.8 cm x 8 mm x 1.0 cm. Isthmus Thickness: 3 mm.  No nodules visualized. Lymphadenopathy None visualized. IMPRESSION: Multinodular thyroid. Both left-sided and right-sided nodules meet criteria for biopsy, should these regions in fact be focal nodules and not multiple adjacent nodules. Findings meet consensus criteria for biopsy. Ultrasound-guided fine needle aspiration should be considered, as per the consensus statement: Management of Thyroid Nodules Detected at [place]: Society of Radiologists in Ultrasound Consensus Conference Statement. Radiology 2005; [id number]. Signed, Yvone Neu. Loreta Ave, DO [facility] Radiology Electronically Signed   By: Gilmer Mor D.O.   On: 12/07/2015 16:58    Assessment & Plan:   Tagen was seen today for sore throat and toe injury.  Diagnoses and all orders for this visit:  Acute URI -     methylPREDNISolone acetate (DEPO-MEDROL) injection 40 mg -     Discontinue: HYDROcodone-homatropine (HYCODAN) 5-1.5 MG/5ML syrup; Take 5 mLs by mouth every 12 (twelve) hours as needed for cough. -     dextromethorphan-guaiFENesin (MUCINEX DM)  30-600 MG 12hr tablet; Take 1 tablet by mouth 2 (two) times daily as needed for cough. -     fluticasone (FLONASE) 50 MCG/ACT nasal spray; Place 2 sprays into both nostrils daily. -     oxymetazoline (AFRIN NASAL SPRAY) 0.05 % nasal spray; Place 1 spray into both nostrils 2 (two) times daily. Use only for 3days, then stop -     HYDROcodone-homatropine (HYCODAN) 5-1.5 MG/5ML syrup; Take 5 mLs by mouth every 12 (twelve) hours as needed for cough.   I am having [name] [name] start on dextromethorphan-guaiFENesin, fluticasone, and oxymetazoline. I am also having her maintain her aspirin, multivitamin, hydrochlorothiazide, LOSARTAN POTASSIUM PO, PRAVASTATIN SODIUM PO, and HYDROcodone-homatropine. We administered methylPREDNISolone acetate.  Meds ordered this encounter  Medications  . methylPREDNISolone acetate (DEPO-MEDROL) injection 40 mg  . DISCONTD: HYDROcodone-homatropine (HYCODAN) 5-1.5 MG/5ML syrup    Sig: Take 5 mLs by mouth every 12 (twelve) hours as needed for cough.    Dispense:  60 mL    Refill:  0    Order Specific Question:   Supervising Provider    Answer:   Dianne Dun [[id number]]  . dextromethorphan-guaiFENesin (MUCINEX DM) 30-600 MG 12hr tablet    Sig: Take 1 tablet by mouth 2 (two) times daily as needed for cough.    Dispense:  14 tablet    Refill:  0    Order Specific Question:   Supervising Provider    Answer:   Dianne Dun [[id number]]  . fluticasone (FLONASE) 50 MCG/ACT nasal spray    Sig: Place 2 sprays into both nostrils daily.    Dispense:  16 g    Refill:  0    Order Specific Question:   Supervising Provider    Answer:   Dianne Dun [[id number]]  . oxymetazoline (AFRIN NASAL SPRAY) 0.05 % nasal spray    Sig: Place 1 spray into both nostrils 2 (two) times daily. Use only for 3days, then stop    Dispense:  30 mL    Refill:  0    Order Specific Question:   Supervising Provider    Answer:   Dianne Dun [[id number]]  . HYDROcodone-homatropine (HYCODAN) 5-1.5 MG/5ML syrup    Sig:  Take 5 mLs by mouth every 12 (twelve) hours as needed for cough.    Dispense:  60 mL    Refill:  0   Follow-up: No Follow-up on file.  Alysia Penna, NP

## 2017-12-21 NOTE — Telephone Encounter (Signed)
Copied from CRM [phone]. Topic: Appointment Scheduling - Scheduling Inquiry for Clinic >> Dec 21, 2017  9:20 AM Debroah LoopLander, Lumin L wrote: Reason for CRM: Patient [facility] to [facility]  Please call back to schedule if approved to transfer.

## 2017-12-21 NOTE — Telephone Encounter (Signed)
Okay with me 

## 2017-12-21 NOTE — Telephone Encounter (Signed)
An end date was automatically generated for the pravastatin and losartan rx's. I created an addendum multiple times and removed the end date, however I kept receiving an escribe error message. I called the pharmacy and they informed me that they received all three rx's and were actively working on getting them ready for the patient. She was unable to let me know if the end date was actually on them but she took a verbal from me to approve the rx's for #30 with 11 refills.

## 2017-12-27 ENCOUNTER — Telehealth: Payer: Self-pay | Admitting: *Deleted

## 2017-12-27 DIAGNOSIS — E785 Hyperlipidemia, unspecified: Secondary | ICD-10-CM

## 2017-12-27 MED ORDER — ROSUVASTATIN CALCIUM 20 MG PO TABS: 20.0000 mg | ORAL_TABLET | Freq: Every day | ORAL | 6 refills | Status: DC

## 2017-12-27 NOTE — Telephone Encounter (Signed)
Updated medication list.  Sent prescription for crestor 20 mg and ordered lipids for 10 weeks.

## 2017-12-27 NOTE — Telephone Encounter (Signed)
-----   Message from Pricilla RifflePaula Ross V, MD sent at 12/24/2017  9:26 PM EST ----- Called pt with results    Plaquing on coronary arteries noted I would recomm continuing asa 81 mg Increase pravachol to 40 mg daily to finish course (just filled) When runs out switch to crestor 20 mg  F/U lipids 8 wks after switch  Goal LDL 70 Pt still having intermittent CP  I still think atypical She is [place] [facility]

## 2018-01-17 ENCOUNTER — Other Ambulatory Visit: Payer: Self-pay | Admitting: Nurse Practitioner

## 2018-01-17 DIAGNOSIS — J069 Acute upper respiratory infection, unspecified: Secondary | ICD-10-CM

## 2018-01-22 ENCOUNTER — Other Ambulatory Visit (HOSPITAL_COMMUNITY): Payer: 59

## 2018-01-29 ENCOUNTER — Other Ambulatory Visit (HOSPITAL_COMMUNITY): Payer: 59

## 2018-02-01 ENCOUNTER — Other Ambulatory Visit: Payer: Self-pay

## 2018-02-01 ENCOUNTER — Ambulatory Visit (HOSPITAL_COMMUNITY): Payer: 59 | Attending: Cardiovascular Disease

## 2018-02-01 ENCOUNTER — Telehealth: Payer: Self-pay | Admitting: Internal Medicine

## 2018-02-01 DIAGNOSIS — I5030 Unspecified diastolic (congestive) heart failure: Secondary | ICD-10-CM | POA: Insufficient documentation

## 2018-02-01 DIAGNOSIS — R011 Cardiac murmur, unspecified: Secondary | ICD-10-CM | POA: Diagnosis not present

## 2018-02-01 DIAGNOSIS — I071 Rheumatic tricuspid insufficiency: Secondary | ICD-10-CM | POA: Insufficient documentation

## 2018-02-01 NOTE — Telephone Encounter (Signed)
Walk In pt Form-please call questions about meds.

## 2018-02-04 ENCOUNTER — Other Ambulatory Visit: Payer: Self-pay | Admitting: *Deleted

## 2018-06-04 DIAGNOSIS — M7061 Trochanteric bursitis, right hip: Secondary | ICD-10-CM | POA: Diagnosis not present

## 2018-06-04 DIAGNOSIS — M79671 Pain in right foot: Secondary | ICD-10-CM | POA: Insufficient documentation

## 2018-06-04 DIAGNOSIS — M533 Sacrococcygeal disorders, not elsewhere classified: Secondary | ICD-10-CM | POA: Diagnosis not present

## 2018-06-04 DIAGNOSIS — M25551 Pain in right hip: Secondary | ICD-10-CM | POA: Diagnosis not present

## 2018-06-04 DIAGNOSIS — M5136 Other intervertebral disc degeneration, lumbar region: Secondary | ICD-10-CM | POA: Diagnosis not present

## 2018-06-05 DIAGNOSIS — M5136 Other intervertebral disc degeneration, lumbar region: Secondary | ICD-10-CM | POA: Insufficient documentation

## 2018-06-05 DIAGNOSIS — M51369 Other intervertebral disc degeneration, lumbar region without mention of lumbar back pain or lower extremity pain: Secondary | ICD-10-CM | POA: Insufficient documentation

## 2018-06-05 DIAGNOSIS — M533 Sacrococcygeal disorders, not elsewhere classified: Secondary | ICD-10-CM | POA: Insufficient documentation

## 2018-06-05 DIAGNOSIS — M7061 Trochanteric bursitis, right hip: Secondary | ICD-10-CM | POA: Insufficient documentation

## 2018-07-12 ENCOUNTER — Other Ambulatory Visit: Payer: Self-pay

## 2018-07-12 ENCOUNTER — Ambulatory Visit (INDEPENDENT_AMBULATORY_CARE_PROVIDER_SITE_OTHER): Payer: BLUE CROSS/BLUE SHIELD | Admitting: Family Medicine

## 2018-07-12 ENCOUNTER — Encounter: Payer: Self-pay | Admitting: Family Medicine

## 2018-07-12 VITALS — BP 114/78 | HR 91 | Temp 97.5°F | Ht 64.5 in | Wt 190.0 lb

## 2018-07-12 DIAGNOSIS — J029 Acute pharyngitis, unspecified: Secondary | ICD-10-CM | POA: Diagnosis not present

## 2018-07-12 HISTORY — DX: Acute pharyngitis, unspecified: J02.9

## 2018-07-12 NOTE — Progress Notes (Signed)
[name] [name] - 57 y.o. female MRN [id number]  Date of birth: 09-05-1961  Subjective Chief Complaint  Patient presents with  . Sore Throat    sore throat is worse at night, been going on for about 2 weeks.    HPI [name] [name] is a 57 y.o. female here today with complaint of sore throat.  Reports that symptoms began about 2 weeks ago after returning from furniture show in [place].  She also has associated hoarseness and mild nasal congestion.  Pain seems to be worse at night and when she first wakes up in the morning.  She has tried chloraseptic spray which provided some relief.  She did have some mild reflux while taking steroid pack from her orthopedic a couple of weeks ago, but nothing since.  She denies fever, difficulty swallowing, shortness of breath, or nausea.  She quit smoking x4 years ago  ROS:  A comprehensive ROS was completed and negative except as noted per HPI  Allergies  Allergen Reactions  . Codeine     REACTION: makes pt sick    Past Medical History:  Diagnosis Date  . ANXIETY   . ARTHRITIS   . ASTHMA   . DEGENERATIVE JOINT DISEASE   . EMPHYSEMA, MILD   . GALLBLADDER DISEASE, HX OF   . HYPERLIPIDEMIA   . HYPERTENSION   . Multinodular goiter 05/2001 dx   s/p bx: benign  . TOBACCO ABUSE    hx of; quit 1/22016  . Vitamin D deficiency     Past Surgical History:  Procedure Laterality Date  . CHOLECYSTECTOMY  2008  . JOINT REPLACEMENT Right 1998   total hip  . thyroid biopsy  2002    Social History   Socioeconomic History  . Marital status: Divorced    Spouse name: Not on file  . Number of children: Not on file  . Years of education: Not on file  . Highest education level: Not on file  Occupational History  . Not on file  Social Needs  . Financial resource strain: Not on file  . Food insecurity:    Worry: Not on file    Inability: Not on file  . Transportation needs:    Medical: Not on file    Non-medical: Not on file  Tobacco Use  . Smoking  status: Former Smoker    Packs/day: 1.00    Years: 15.00    Pack years: 15.00    Types: Cigarettes    Last attempt to quit: 12/11/2014    Years since quitting: 3.5  . Smokeless tobacco: Never Used  Substance and Sexual Activity  . Alcohol use: No    Alcohol/week: 0.0 oz  . Drug use: No  . Sexual activity: Not Currently    Birth control/protection: Post-menopausal  Lifestyle  . Physical activity:    Days per week: Not on file    Minutes per session: Not on file  . Stress: Not on file  Relationships  . Social connections:    Talks on phone: Not on file    Gets together: Not on file    Attends religious service: Not on file    Active member of club or organization: Not on file    Attends meetings of clubs or organizations: Not on file    Relationship status: Not on file  Other Topics Concern  . 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 Hypertension Mother   . Hyperlipidemia Mother   . Stroke Mother [id number]       L HP  . Alcohol abuse Father [id number]       sober since age 58  . Arthritis Father   . Hypertension Father   . Hyperlipidemia Father   . Lung cancer Father [id number]       smoker  . Lung cancer Paternal Grandmother   . Rectal cancer Maternal Grandmother     Health Maintenance  Topic Date Due  . MAMMOGRAM  03/11/2016  . INFLUENZA VACCINE  07/11/2018  . PAP SMEAR  08/11/2018  . TETANUS/TDAP  03/04/2023  . COLONOSCOPY  06/02/2025  . Hepatitis C Screening  Completed  . HIV Screening  Completed    ----------------------------------------------------------------------------------------------------------------------------------------------------------------------------------------------------------------- Physical Exam BP 114/78 (BP Location: Left Arm, Patient Position: Sitting, Cuff Size: Normal)   Pulse 91   Temp (!) 97.5 F (36.4  C) (Oral)   Ht 5' 4.5" (1.638 m)   Wt 190 lb (86.2 kg)   SpO2 96%   BMI 32.11 kg/m   Physical Exam  Constitutional: She is oriented to person, place, and time. She appears well-nourished. No distress.  HENT:  Head: Normocephalic and atraumatic.  Right Ear: Tympanic membrane normal. No middle ear effusion.  Left Ear: A middle ear effusion (serous) is present.  Mouth/Throat: No oral lesions. No uvula swelling. Posterior oropharyngeal erythema present. No oropharyngeal exudate, posterior oropharyngeal edema or tonsillar abscesses. Tonsils are 0 on the right. Tonsils are 0 on the left. No tonsillar exudate.  Neck: Neck supple.  Cardiovascular: Normal rate, regular rhythm and normal heart sounds.  Pulmonary/Chest: Effort normal and breath sounds normal.  Lymphadenopathy:    She has no cervical adenopathy.  Neurological: She is alert and oriented to person, place, and time.  Skin: Skin is warm and dry. No rash noted.  Psychiatric: She has a normal mood and affect. Her behavior is normal.    ------------------------------------------------------------------------------------------------------------------------------------------------------------------------------------------------------------------- Assessment and Plan  Pharyngitis Likely viral vs allergic in etiology. Recommend increased fluid intake Recommend using humidifier by bedside Discussed trial of steroid, declines at this time.   May continue OTC medication PRN

## 2018-07-12 NOTE — Assessment & Plan Note (Signed)
Likely viral vs allergic in etiology. Recommend increased fluid intake Recommend using humidifier by bedside Discussed trial of steroid, declines at this time.   May continue OTC medication PRN

## 2018-07-12 NOTE — Patient Instructions (Signed)
Try some of the home care tips we discussed.  Let me know if things aren't improving over the next 4-5 days.    Sore Throat When you have a sore throat, your throat may:  Hurt.  Burn.  Feel irritated.  Feel scratchy.  Many things can cause a sore throat, including:  An infection.  Allergies.  Dryness in the air.  Smoke or pollution.  Gastroesophageal reflux disease (GERD).  A tumor.  A sore throat can be the first sign of another sickness. It can happen with other problems, like coughing or a fever. Most sore throats go away without treatment. Follow these instructions at home:  Take over-the-counter medicines only as told by your doctor.  Drink enough fluids to keep your pee (urine) clear or pale yellow.  Rest when you feel you need to.  To help with pain, try: ? Sipping warm liquids, such as broth, herbal tea, or warm water. ? Eating or drinking cold or frozen liquids, such as frozen ice pops. ? Gargling with a salt-water mixture 3-4 times a day or as needed. To make a salt-water mixture, add -1 tsp of salt in 1 cup of warm water. Mix it until you cannot see the salt anymore. ? Sucking on hard candy or throat lozenges. ? Putting a cool-mist humidifier in your bedroom at night. ? Sitting in the bathroom with the door closed for 5-10 minutes while you run hot water in the shower.  Do not use any tobacco products, such as cigarettes, chewing tobacco, and e-cigarettes. If you need help quitting, ask your doctor. Contact a doctor if:  You have a fever for more than 2-3 days.  You keep having symptoms for more than 2-3 days.  Your throat does not get better in 7 days.  You have a fever and your symptoms suddenly get worse. Get help right away if:  You have trouble breathing.  You cannot swallow fluids, soft foods, or your saliva.  You have swelling in your throat or neck that gets worse.  You keep feeling like you are going to throw up (vomit).  You keep  throwing up. This information is not intended to replace advice given to you by your health care provider. Make sure you discuss any questions you have with your health care provider. Document Released: 09/05/2008 Document Revised: 07/23/2016 Document Reviewed: 09/17/2015 Elsevier Interactive Patient Education  [employer]2018 [employer]

## 2018-07-22 ENCOUNTER — Encounter: Payer: Self-pay | Admitting: Nurse Practitioner

## 2018-07-22 ENCOUNTER — Ambulatory Visit (INDEPENDENT_AMBULATORY_CARE_PROVIDER_SITE_OTHER): Payer: PRIVATE HEALTH INSURANCE | Admitting: Nurse Practitioner

## 2018-07-22 VITALS — BP 118/72 | HR 77 | Temp 98.1°F | Ht 64.5 in | Wt 191.4 lb

## 2018-07-22 DIAGNOSIS — J029 Acute pharyngitis, unspecified: Secondary | ICD-10-CM | POA: Diagnosis not present

## 2018-07-22 DIAGNOSIS — J0100 Acute maxillary sinusitis, unspecified: Secondary | ICD-10-CM | POA: Diagnosis not present

## 2018-07-22 MED ORDER — FLUTICASONE PROPIONATE 50 MCG/ACT NA SUSP: 2.0000 | Freq: Every day | NASAL | 1 refills | Status: DC

## 2018-07-22 MED ORDER — AMOXICILLIN 500 MG PO TABS: 500.0000 mg | ORAL_TABLET | Freq: Two times a day (BID) | ORAL | 0 refills | Status: AC

## 2018-07-22 MED ORDER — HYDROCODONE-HOMATROPINE 5-1.5 MG/5ML PO SYRP: 5.0000 mL | ORAL_SOLUTION | Freq: Every evening | ORAL | 0 refills | Status: DC | PRN

## 2018-07-22 NOTE — Progress Notes (Signed)
Subjective:  Patient ID: [name] [name], female    DOB: 1961/02/20  Age: 57 y.o. MRN: [id number]  CC: Cough (pt c/o productive cough, thick phlegm in chest, congestion, pt seen couple weeks ago and no improvement , worse)  Cough  This is a new problem. The current episode started 1 to 4 weeks ago. The problem has been waxing and waning. The cough is productive of purulent sputum. Associated symptoms include chest pain, ear congestion, ear pain, headaches, nasal congestion, postnasal drip, rhinorrhea and a sore throat. Pertinent negatives include no fever, shortness of breath or wheezing. The symptoms are aggravated by lying down. Risk factors for lung disease include travel. She has tried OTC cough suppressant for the symptoms. The treatment provided mild relief.   Reviewed past Medical, Social and Family history today.  Outpatient Medications Prior to Visit  Medication Sig Dispense Refill  . aspirin 81 MG tablet Take 81 mg by mouth daily.      . hydrochlorothiazide (HYDRODIURIL) 12.5 MG tablet Take 1 tablet (12.5 mg total) by mouth daily. 30 tablet 11  . losartan (COZAAR) 50 MG tablet Take 1 tablet (50 mg total) by mouth daily. 30 tablet 11  . methocarbamol (ROBAXIN) 500 MG tablet TAKE 1 TABLET BY MOUTH THREE TIMES A DAY AS NEEDED FOR 10 DAYS  1  . Multiple Vitamin (MULTIVITAMIN) tablet Take 1 tablet by mouth daily.      . pravastatin (PRAVACHOL) 20 MG tablet pravastatin 20 mg tablet  TAKE 1 TABLET BY MOUTH EVERY DAY IN THE EVENING    . rosuvastatin (CRESTOR) 20 MG tablet Take 1 tablet (20 mg total) by mouth daily. (Patient not taking: Reported on 07/22/2018) 30 tablet 6  . predniSONE (STERAPRED UNI-PAK 21 TAB) 5 MG (21) TBPK tablet TAKE 6 TABLETS ON DAY 1 AS DIRECTED ON PACKAGE AND DECREASE BY 1 TAB EACH DAY FOR A TOTAL OF 6 DAYS  1   No facility-administered medications prior to visit.     ROS See HPI  Objective:  BP 118/72 (BP Location: Right Arm, Patient Position: Sitting, Cuff Size:  Normal)   Pulse 77   Temp 98.1 F (36.7 C) (Oral)   Ht 5' 4.5" (1.638 m)   Wt 191 lb 6.4 oz (86.8 kg)   SpO2 98%   BMI 32.35 kg/m   BP Readings from Last 3 Encounters:  07/22/18 118/72  07/12/18 114/78  12/21/17 120/86    Wt Readings from Last 3 Encounters:  07/22/18 191 lb 6.4 oz (86.8 kg)  07/12/18 190 lb (86.2 kg)  12/21/17 190 lb (86.2 kg)    Physical Exam  Constitutional: She is oriented to person, place, and time. No distress.  HENT:  Right Ear: External ear and ear canal normal. Tympanic membrane is not injected, not erythematous and not bulging. A middle ear effusion is present.  Left Ear: External ear and ear canal normal. Tympanic membrane is injected. Tympanic membrane is not erythematous and not bulging. A middle ear effusion is present.  Nose: Mucosal edema and rhinorrhea present. Right sinus exhibits maxillary sinus tenderness. Left sinus exhibits maxillary sinus tenderness.  Mouth/Throat: Uvula is midline. Posterior oropharyngeal erythema present. No oropharyngeal exudate.  Neck: Normal range of motion. Neck supple.  Cardiovascular: Normal rate and regular rhythm.  Pulmonary/Chest: Effort normal and breath sounds normal.  Lymphadenopathy:    She has no cervical adenopathy.  Neurological: She is alert and oriented to person, place, and time.  Skin: No rash noted.  Vitals reviewed.  Lab Results  Component Value Date   WBC 5.8 07/30/2017   HGB 13.1 07/30/2017   HCT 38.7 07/30/2017   PLT 220.0 07/30/2017   GLUCOSE 86 07/30/2017   CHOL 194 07/30/2017   TRIG 225.0 (H) 07/30/2017   HDL 44.00 07/30/2017   LDLDIRECT 126.0 07/30/2017   LDLCALC 87 03/03/2013   ALT 13 07/30/2017   AST 14 07/30/2017   NA 141 07/30/2017   K 3.6 07/30/2017   CL 101 07/30/2017   CREATININE 0.78 07/30/2017   BUN 13 07/30/2017   CO2 36 (H) 07/30/2017   TSH 0.78 07/30/2017   INR 1.0 07/01/2007   HGBA1C 5.7 10/09/2011    Ct Cardiac Scoring  Addendum Date: 12/21/2017     ADDENDUM REPORT: 12/21/2017 14:48 CLINICAL DATA:  Risk stratification EXAM: Coronary Calcium Score TECHNIQUE: The patient was scanned on a Siemens Somatom 64 slice scanner. Axial non-contrast 3 mm slices were carried out through the heart. The data set was analyzed on a dedicated work station and scored using the Agatson method. FINDINGS: Non-cardiac: See separate report from [facility]. Ascending Aorta: Normal diameter 3.2 cm Pericardium: Normal Coronary arteries: Focal area of calcium seen in the ostial and proximal LAD IMPRESSION: Coronary calcium score of 110 . This was 98 th percentile for age and sex matched control. Charlton Haws Electronically Signed   By: Charlton Haws M.D.   On: 12/21/2017 14:48   Result Date: 12/21/2017 EXAM: OVER-READ INTERPRETATION  CT CHEST The following report is an over-read performed by radiologist Dr. Charlett Nose of [facility] on 12/21/2017. This over-read does not include interpretation of cardiac or coronary anatomy or pathology. The coronary calcium score interpretation by the cardiologist is attached. COMPARISON:  None. FINDINGS: Vascular: Heart is normal size.  Visualized aorta is normal caliber. Mediastinum/Nodes: No adenopathy in the lower mediastinum or hila. Lungs/Pleura: Visualized lungs are clear.  No effusions. Upper Abdomen: Imaging into the upper abdomen shows no acute findings. Musculoskeletal: Chest wall soft tissues are unremarkable. No acute bony abnormality. IMPRESSION: No acute or significant extracardiac abnormality. Electronically Signed: By: Charlett Nose M.D. On: 12/21/2017 14:42    Assessment & Plan:   [name] [name] was seen today for cough.  Diagnoses and all orders for this visit:  Pharyngitis, unspecified etiology -     fluticasone (FLONASE) 50 MCG/ACT nasal spray; Place 2 sprays into both nostrils daily. -     HYDROcodone-homatropine (HYCODAN) 5-1.5 MG/5ML syrup; Take 5 mLs by mouth at bedtime as needed for cough. -      amoxicillin (AMOXIL) 500 MG tablet; Take 1 tablet (500 mg total) by mouth 2 (two) times daily for 10 days.  Acute non-recurrent maxillary sinusitis -     fluticasone (FLONASE) 50 MCG/ACT nasal spray; Place 2 sprays into both nostrils daily. -     HYDROcodone-homatropine (HYCODAN) 5-1.5 MG/5ML syrup; Take 5 mLs by mouth at bedtime as needed for cough. -     amoxicillin (AMOXIL) 500 MG tablet; Take 1 tablet (500 mg total) by mouth 2 (two) times daily for 10 days.   I have discontinued [name] [name]'s predniSONE. I am also having her start on fluticasone, HYDROcodone-homatropine, and amoxicillin. Additionally, I am having her maintain her aspirin, multivitamin, hydrochlorothiazide, losartan, rosuvastatin, methocarbamol, and pravastatin.  Meds ordered this encounter  Medications  . fluticasone (FLONASE) 50 MCG/ACT nasal spray    Sig: Place 2 sprays into both nostrils daily.    Dispense:  16 g    Refill:  1  Order Specific Question:   Supervising Provider    Answer:   MATTHEWS, CODY [[id number]]  . HYDROcodone-homatropine (HYCODAN) 5-1.5 MG/5ML syrup    Sig: Take 5 mLs by mouth at bedtime as needed for cough.    Dispense:  60 mL    Refill:  0    Order Specific Question:   Supervising Provider    Answer:   MATTHEWS, CODY [[id number]]  . amoxicillin (AMOXIL) 500 MG tablet    Sig: Take 1 tablet (500 mg total) by mouth 2 (two) times daily for 10 days.    Dispense:  20 tablet    Refill:  0    Order Specific Question:   Supervising Provider    Answer:   MATTHEWS, CODY [[id number]]    Follow-up: Return if symptoms worsen or fail to improve.  [name] Pennaharlotte Susana Gripp, NP

## 2018-07-22 NOTE — Patient Instructions (Signed)
Continue mucinex Dm during day for cough. Use hycodan at bedtime for cough.  Maintain adequate oral hydration   Eustachian Tube Dysfunction The eustachian tube connects the middle ear to the back of the nose. It regulates air pressure in the middle ear by allowing air to move between the ear and nose. It also helps to drain fluid from the middle ear space. When the eustachian tube does not function properly, air pressure, fluid, or both can build up in the middle ear. Eustachian tube dysfunction can affect one or both ears. What are the causes? This condition happens when the eustachian tube becomes blocked or cannot open normally. This may result from:  Ear infections.  Colds and other upper respiratory infections.  Allergies.  Irritation, such as from cigarette smoke or acid from the stomach coming up into the esophagus (gastroesophageal reflux).  Sudden changes in air pressure, such as from descending in an airplane.  Abnormal growths in the nose or throat, such as nasal polyps, tumors, or enlarged tissue at the back of the throat (adenoids).  What increases the risk? This condition may be more likely to develop in people who smoke and people who are overweight. Eustachian tube dysfunction may also be more likely to develop in children, especially children who have:  Certain birth defects of the mouth, such as cleft palate.  Large tonsils and adenoids.  What are the signs or symptoms? Symptoms of this condition may include:  A feeling of fullness in the ear.  Ear pain.  Clicking or popping noises in the ear.  Ringing in the ear.  Hearing loss.  Loss of balance.  Symptoms may get worse when the air pressure around you changes, such as when you travel to an area of high elevation or fly on an airplane. How is this diagnosed? This condition may be diagnosed based on:  Your symptoms.  A physical exam of your ear, nose, and throat.  Tests, such as those that  measure: ? The movement of your eardrum (tympanogram). ? Your hearing (audiometry).  How is this treated? Treatment depends on the cause and severity of your condition. If your symptoms are mild, you may be able to relieve your symptoms by moving air into ("popping") your ears. If you have symptoms of fluid in your ears, treatment may include:  Decongestants.  Antihistamines.  Nasal sprays or ear drops that contain medicines that reduce swelling (steroids).  In some cases, you may need to have a procedure to drain the fluid in your eardrum (myringotomy). In this procedure, a small tube is placed in the eardrum to:  Drain the fluid.  Restore the air in the middle ear space.  Follow these instructions at home:  Take over-the-counter and prescription medicines only as told by your health care provider.  Use techniques to help pop your ears as recommended by your health care provider. These may include: ? Chewing gum. ? Yawning. ? Frequent, forceful swallowing. ? Closing your mouth, holding your nose closed, and gently blowing as if you are trying to blow air out of your nose.  Do not do any of the following until your health care provider approves: ? Travel to high altitudes. ? Fly in airplanes. ? Work in a Estate agentpressurized cabin or room. ? Scuba dive.  Keep your ears dry. Dry your ears completely after showering or bathing.  Do not smoke.  Keep all follow-up visits as told by your health care provider. This is important. Contact a health care provider  if:  Your symptoms do not go away after treatment.  Your symptoms come back after treatment.  You are unable to pop your ears.  You have: ? A fever. ? Pain in your ear. ? Pain in your head or neck. ? Fluid draining from your ear.  Your hearing suddenly changes.  You become very dizzy.  You lose your balance. This information is not intended to replace advice given to you by your health care provider. Make sure you  discuss any questions you have with your health care provider. Document Released: 12/24/2015 Document Revised: 05/04/2016 Document Reviewed: 12/16/2014 Elsevier Interactive Patient Education  [employer]2018 Elsevier Inc.

## 2018-08-13 DIAGNOSIS — M7061 Trochanteric bursitis, right hip: Secondary | ICD-10-CM | POA: Diagnosis not present

## 2018-08-13 DIAGNOSIS — M25551 Pain in right hip: Secondary | ICD-10-CM | POA: Diagnosis not present

## 2018-08-17 ENCOUNTER — Other Ambulatory Visit: Payer: Self-pay | Admitting: Internal Medicine

## 2018-08-20 ENCOUNTER — Other Ambulatory Visit (HOSPITAL_COMMUNITY): Payer: Self-pay | Admitting: Orthopedic Surgery

## 2018-08-20 DIAGNOSIS — Z96641 Presence of right artificial hip joint: Secondary | ICD-10-CM

## 2018-08-26 ENCOUNTER — Encounter (HOSPITAL_COMMUNITY)
Admission: RE | Admit: 2018-08-26 | Discharge: 2018-08-26 | Disposition: A | Discharge status: Home / Self Care | Payer: BLUE CROSS/BLUE SHIELD | Source: Ambulatory Visit | Attending: Orthopedic Surgery | Admitting: Orthopedic Surgery

## 2018-08-26 DIAGNOSIS — Z96641 Presence of right artificial hip joint: Secondary | ICD-10-CM | POA: Insufficient documentation

## 2018-08-26 DIAGNOSIS — Z471 Aftercare following joint replacement surgery: Secondary | ICD-10-CM | POA: Diagnosis not present

## 2018-08-26 MED ORDER — TECHNETIUM TC 99M MEDRONATE IV KIT
21.6000 | PACK | Freq: Once | INTRAVENOUS | Status: AC | PRN
  Administered 2018-08-26: 21.6 via INTRAVENOUS

## 2018-09-02 DIAGNOSIS — M25551 Pain in right hip: Secondary | ICD-10-CM | POA: Diagnosis not present

## 2018-09-02 DIAGNOSIS — M7061 Trochanteric bursitis, right hip: Secondary | ICD-10-CM | POA: Diagnosis not present

## 2018-09-02 DIAGNOSIS — M79651 Pain in right thigh: Secondary | ICD-10-CM | POA: Diagnosis not present

## 2018-10-21 DIAGNOSIS — M25551 Pain in right hip: Secondary | ICD-10-CM | POA: Diagnosis not present

## 2018-12-20 ENCOUNTER — Other Ambulatory Visit: Payer: Self-pay | Admitting: Internal Medicine

## 2018-12-20 ENCOUNTER — Telehealth: Payer: Self-pay | Admitting: *Deleted

## 2018-12-20 NOTE — Telephone Encounter (Signed)
    [facility] Pre-operative Risk Assessment    Request for surgical clearance:  1. What type of surgery is being performed? REVISION RIGHT TOTAL HIP   2. When is this surgery scheduled? 01/23/19   3. What type of clearance is required (medical clearance vs. Pharmacy clearance to hold med vs. Both)? MEDICAL  4. Are there any medications that need to be held prior to surgery and how long?ASA   5. Practice name and name of physician performing surgery? [facility]; DR. Alvan Dame   6. What is your office phone number [phone]    7.   What is your office fax number [phone]  8.   Anesthesia type (None, local, MAC, general) ? SPINAL   Julaine Hua 12/20/2018, 9:13 AM  _________________________________________________________________   (provider comments below)

## 2018-12-20 NOTE — Telephone Encounter (Signed)
Pt has been scheduled to see Tereso Newcomer, PA-C 01/13/19 for surgical clearance. Pt thanked me for the call.

## 2018-12-20 NOTE — Telephone Encounter (Signed)
   Primary Cardiologist:Paula Tenny Craw, MD  Chart reviewed as part of pre-operative protocol coverage. Because of [name] [name]'s past medical history and time since last visit, he/she will require a follow-up visit in order to better assess preoperative cardiovascular risk.  Last OV 12/2017, cardiac CT with 94th percentile for age and sex matched control. No specific f/u requested at this time but a good idea since degree of coronary calcification. Would suggest OV before clearing as it will have been >1 year since last visit. Given no prior hx of PCI should be able to hold ASA for procedure but will need to determine at time of appt.  Pre-op covering staff: - Please schedule appointment and call patient to inform them. - Please contact requesting surgeon's office via preferred method (i.e, phone, fax) to inform them of need for appointment prior to surgery.  Laurann Montana, PA-C  12/20/2018, 4:32 PM

## 2018-12-23 ENCOUNTER — Other Ambulatory Visit: Payer: Self-pay | Admitting: Internal Medicine

## 2018-12-23 DIAGNOSIS — I10 Essential (primary) hypertension: Secondary | ICD-10-CM

## 2019-01-13 ENCOUNTER — Encounter: Payer: Self-pay | Admitting: Physician Assistant

## 2019-01-13 NOTE — Progress Notes (Signed)
Cardiology Office Note   Date:  01/14/2019   ID:  [name] [name], DOB 1961/10/03, MRN [id number]  PCP:  Anne Ng, NP  Cardiologist: Dr. Tenny Craw, MD  Chief Complaint  Patient presents with  . Pre-op Exam   History of Present Illness: [name] [name] is a 58 y.o. female who presents for preoperative clearance, seen for Dr. Tenny Craw.   [name] has a history of chest pain, HLD, HTN and tobacco use.  She was last seen by Dr. Tenny Craw 12/2017 and reported having intermittent jabbing chest pain/tightness with arm weakness thought to be related to indigestion.  She also reported some fluttering but no dizziness.  Went to an urgent care in [place] during that time in which an EKG was performed that was reported as being normal however she was instructed to follow with cardiology. Per chart review, her chest pain/tightness was atypical and not convincing as cardiac pain.  A cardiac CT calcium score was performed 12/21/2017 which showed a coronary calcium score of 110 noted to be in the 94th percentile for age and sex matched control.  Additionally, a murmur was heard on physical exam in which she was set up for an echocardiogram for further evaluation.  This was performed on 02/01/2018 which revealed an LVEF of 60 to 65%, no regional wall motion abnormalities and a G1 DD.  There were no valve dysfunctions noted.  Today, she presents for preoperative evaluation for revision right total hip scheduled for 01/23/2019 with Dr. Charlann Boxer. Given her cardiac CT results and greater than 1 year duration since her last office visit, she was asked to present for an office visit for further evaluation. She denies chest pain, palpitations, SOB, dizziness or syncope.   She works in [employer] and reports that she ambulates without anginal symptoms (greater than 10,000 steps/day) and is able to complete ADL/IADL's. She does have increased stress with her job however manages well.   Past Medical History:  Diagnosis  Date  . ANXIETY   . ARTHRITIS   . ASTHMA   . DEGENERATIVE JOINT DISEASE   . EMPHYSEMA, MILD   . GALLBLADDER DISEASE, HX OF   . HYPERLIPIDEMIA   . HYPERTENSION   . Multinodular goiter 05/2001 dx   s/p bx: benign  . TOBACCO ABUSE    hx of; quit 1/22016  . Vitamin D deficiency     Past Surgical History:  Procedure Laterality Date  . CHOLECYSTECTOMY  2008  . JOINT REPLACEMENT Right 1998   total hip  . thyroid biopsy  2002     Current Outpatient Medications  Medication Sig Dispense Refill  . Ascorbic Acid (VITAMIN C) 250 MG CHEW Chew 250 mg by mouth daily.    Marland Kitchen aspirin 81 MG tablet Take 81 mg by mouth daily.      . Calcium Carbonate-Vitamin D (SM CALCIUM 500/VITAMIN D3 PO) Take 2 tablets by mouth daily.    . hydrochlorothiazide (HYDRODIURIL) 12.5 MG tablet Take 1 tablet (12.5 mg total) by mouth daily. Please keep upcoming appt for future refills. Thank you. 90 tablet 3  . losartan (COZAAR) 25 MG tablet Take 1 tablet (25 mg total) by mouth daily. 90 tablet 3  . Multiple Vitamin (MULTIVITAMIN) tablet Take 1 tablet by mouth daily.      . Multiple Vitamins-Minerals (AIRBORNE PO) Take 2 tablets by mouth daily.    Marland Kitchen neomycin-bacitracin-polymyxin (NEOSPORIN) ointment Apply 1 application topically daily as needed for wound care.    . rosuvastatin (  CRESTOR) 20 MG tablet Take 1 tablet (20 mg total) by mouth daily. 90 tablet 3  . tetrahydrozoline (VISINE) 0.05 % ophthalmic solution Place 1 drop into both eyes daily as needed (for dry eyes).     No current facility-administered medications for this visit.    Allergies:   Codeine   Social History:  The patient  reports that she quit smoking about 4 years ago. Her smoking use included cigarettes. She has a 15.00 pack-year smoking history. She has never used smokeless tobacco. She reports that she does not drink alcohol or use drugs.   Family History:  The patient's family history includes Alcohol abuse (age of onset: 3652) in her father;  Arthritis in her father; Hyperlipidemia in her father and mother; Hypertension in her father and mother; Lung cancer in her paternal grandmother; Lung cancer (age of onset: 4979) in her father; Rectal cancer in her maternal grandmother; Stroke (age of onset: 7479) in her mother.   ROS:  Please see the history of present illness.  Otherwise, review of systems are positive for none.   All other systems are reviewed and negative.   PHYSICAL EXAM: VS:  BP (!) 100/42   Pulse 65   Ht 5' 4.5" (1.638 m)   Wt 200 lb 6.4 oz (90.9 kg)   SpO2 95%   BMI 33.87 kg/m  , BMI Body mass index is 33.87 kg/m.   General: Well developed, well nourished, NAD Skin: Warm, dry, intact  Head: Normocephalic, atraumatic, clear, moist mucus membranes. Neck: Negative for carotid bruits. No JVD Lungs:Clear to ausculation bilaterally. No wheezes, rales, or rhonchi. Breathing is unlabored. Cardiovascular: RRR with S1 S2. No murmurs, rubs, gallops, or LV heave appreciated. Abdomen: Soft, non-tender, non-distended with normoactive bowel sounds. No obvious abdominal masses. MSK: Strength and tone appear normal for age. 5/5 in all extremities Extremities: No edema. No clubbing or cyanosis. DP/PT pulses 2+ bilaterally Neuro: Alert and oriented. No focal deficits. No facial asymmetry. MAE spontaneously. Psych: Responds to questions appropriately with normal affect.    EKG:  EKG is ordered today. The ekg ordered today demonstrates NSR with no acute changes  Recent Labs: No results found for requested labs within last 8760 hours.   Lipid Panel    Component Value Date/Time   CHOL 194 07/30/2017 1548   TRIG 225.0 (H) 07/30/2017 1548   TRIG 199 10/13/2009   HDL 44.00 07/30/2017 1548   CHOLHDL 4 07/30/2017 1548   VLDL 45.0 (H) 07/30/2017 1548   LDLCALC 87 03/03/2013 1629   LDLCALC 188 10/13/2009   LDLDIRECT 126.0 07/30/2017 1548     Wt Readings from Last 3 Encounters:  01/14/19 200 lb 6.4 oz (90.9 kg)  07/22/18 191 lb  6.4 oz (86.8 kg)  07/12/18 190 lb (86.2 kg)     Other studies Reviewed: Additional studies/ records that were reviewed today include:   Coronary CTA with calcium scoring 12/21/2017:  FINDINGS: Non-cardiac: [facility].  Ascending Aorta: Normal diameter 3.2 cm  Pericardium: Normal  Coronary arteries: Focal area of calcium seen in the ostial and proximal LAD  IMPRESSION: Coronary calcium score of 110 . This was 3294 th percentile for age and sex matched control.  Echocardiogram 02/01/2018: Study Conclusions  - Left ventricle: The cavity size was normal. There was mild   concentric hypertrophy. Systolic function was normal. The   estimated ejection fraction was in the range of 60% to 65%. Wall   motion was normal; there were no  regional wall motion   abnormalities. Doppler parameters are consistent with abnormal   left ventricular relaxation (grade 1 diastolic dysfunction).   Doppler parameters are consistent with indeterminate ventricular   filling pressure. - Aortic valve: Transvalvular velocity was within the normal range.   There was no stenosis. There was no regurgitation. - Mitral valve: Transvalvular velocity was within the normal range.   There was no evidence for stenosis. There was trivial   regurgitation. - Right ventricle: The cavity size was normal. Wall thickness was   normal. Systolic function was normal. - Atrial septum: No defect or patent foramen ovale was identified   by color flow Doppler. - Tricuspid valve: There was mild regurgitation. - Pulmonary arteries: Systolic pressure was within the normal   range. PA peak pressure: 32 mm Hg (S).  ASSESSMENT AND PLAN:  1.  Presurgical clearance for revision right total hip: -The Revised Cardiac Risk Index indicates that her Perioperative Risk of Major Cardiac Event is (%): 0.4.  Therefore, she is at low risk for perioperative complications.   Her Functional Capacity in  METs is: 8.17 as indicated by the Duke Activity Status Index (DASI).  According to ACC/AHA Guidelines, no further testing is needed.  Proceed with surgery at acceptable risk.  Our service is available as needed in the peri-operative period.  Aspirin can be held prior to her surgery.  Please resume Aspirin post operatively when it is felt to be safe from a bleeding standpoint.   2.  History of chest pain: -Was last seen by Dr. Tenny Craw 12/20/2017 and had complaints of exertional chest pain -Underwent cardiac scoring CT 12/21/2017 which showed a calcium score of 110 which was noted to be in the 94th percentile for age and sex matched control.  There was focal area of calcium seen in the ostial and proximal LAD -Echocardiogram performed with normal LV function and grade 1 diastolic dysfunction -Currently on ASA 81, rosuvastatin -Denies recurrent symptoms in greater than 1 year  3.  HLD: -Last LDL, 87 on 03/03/2013 -Continue rosuvastatin -Will have patient seen in follow up with lipid panel after planned surgery, sooner if needed   4.  HTN: -Stable, 100/42 -Continue HCTZ, losartan -Pt has been only taking losartan 25mg /day and not the prescribed 50mg /day -Will keep her at 25mg  day based on BP today  5.  Tobacco use: -Cessation strongly encouraged   Current medicines are reviewed at length with the patient today.  The patient does not have concerns regarding medicines.  The following changes have been made:  Decreased losartan to 25mg  daily   Labs/ tests ordered today include: None   Orders Placed This Encounter  Procedures  . EKG 12-Lead    Disposition:   FU with Dr. Tenny Craw in 3 months  Signed, Georgie Chard, NP  01/14/2019 1:04 PM    [facility] [address] [place], [place], [place]  [address] Phone: [phone]; Fax: [phone]

## 2019-01-14 ENCOUNTER — Ambulatory Visit: Payer: BLUE CROSS/BLUE SHIELD | Admitting: Nurse Practitioner

## 2019-01-14 ENCOUNTER — Other Ambulatory Visit: Payer: Self-pay | Admitting: Internal Medicine

## 2019-01-14 ENCOUNTER — Ambulatory Visit (INDEPENDENT_AMBULATORY_CARE_PROVIDER_SITE_OTHER): Payer: BLUE CROSS/BLUE SHIELD | Admitting: Cardiology

## 2019-01-14 ENCOUNTER — Encounter: Payer: Self-pay | Admitting: Physician Assistant

## 2019-01-14 VITALS — BP 100/42 | HR 65 | Ht 64.5 in | Wt 200.4 lb

## 2019-01-14 DIAGNOSIS — F419 Anxiety disorder, unspecified: Secondary | ICD-10-CM | POA: Diagnosis not present

## 2019-01-14 DIAGNOSIS — Z01810 Encounter for preprocedural cardiovascular examination: Secondary | ICD-10-CM | POA: Diagnosis not present

## 2019-01-14 DIAGNOSIS — E785 Hyperlipidemia, unspecified: Secondary | ICD-10-CM | POA: Insufficient documentation

## 2019-01-14 DIAGNOSIS — I10 Essential (primary) hypertension: Secondary | ICD-10-CM

## 2019-01-14 DIAGNOSIS — E7841 Elevated Lipoprotein(a): Secondary | ICD-10-CM

## 2019-01-14 MED ORDER — LOSARTAN POTASSIUM 25 MG PO TABS: 25.0000 mg | ORAL_TABLET | Freq: Every day | ORAL | 3 refills | Status: DC

## 2019-01-14 MED ORDER — ROSUVASTATIN CALCIUM 20 MG PO TABS: 20.0000 mg | ORAL_TABLET | Freq: Every day | ORAL | 3 refills | Status: DC

## 2019-01-14 MED ORDER — HYDROCHLOROTHIAZIDE 12.5 MG PO TABS: 12.5000 mg | ORAL_TABLET | Freq: Every day | ORAL | 3 refills | Status: DC

## 2019-01-14 NOTE — Patient Instructions (Signed)
[name] [name]  01/14/2019   Your procedure is scheduled on: 01-23-19  [facility]  [facility]  Report to admitting at                  0610  AM    Call this number if you have problems the morning of surgery [phone]    Remember: Do not eat food or drink liquids :After Midnight.  BRUSH YOUR TEETH MORNING OF SURGERY AND RINSE YOUR MOUTH OUT, NO CHEWING GUM CANDY OR MINTS.     Take these medicines the morning of surgery with A SIP OF WATER: crestor                                You may not have any metal on your body including hair pins and              piercings  Do not wear jewelry, make-up, lotions, powders or perfumes, deodorant             Do not wear nail polish.  Do not shave  48 hours prior to surgery.     Do not bring valuables to the hospital. [facility] IS NOT             RESPONSIBLE   FOR VALUABLES.  Contacts, dentures or bridgework may not be worn into surgery.  Leave suitcase in the car. After surgery it may be brought to your room.                 Please read over the following fact sheets you were given: _____________________________________________________________________           [facility] Pre[facility] Before surgery, you can play an important role.  Because skin is not sterile, your skin needs to be as free of germs as possible.  You can reduce the number of germs on your skin by washing with CHG (chlorahexidine gluconate) soap before surgery.  CHG is an antiseptic cleaner which kills germs and bonds with the skin to continue killing germs even after washing. Please DO NOT use if you have an allergy to CHG or antibacterial soaps.  If your skin becomes reddened/irritated stop using the CHG and inform your nurse when you arrive at Short Stay. Do not shave (including legs and underarms) for at least 48 hours prior to the first CHG shower.  You may shave your face/neck. Please follow these instructions  carefully:  1.  Shower with CHG Soap the night before surgery and the  morning of Surgery.  2.  If you choose to wash your hair, wash your hair first as usual with your  normal  shampoo.  3.  After you shampoo, rinse your hair and body thoroughly to remove the  shampoo.                           4.  Use CHG as you would any other liquid soap.  You can apply chg directly  to the skin and wash                       Gently with a scrungie or clean washcloth.  5.  Apply the CHG Soap to your body ONLY FROM THE NECK DOWN.  Do not use on face/ open                           Wound or open sores. Avoid contact with eyes, ears mouth and genitals (private parts).                       Wash face,  Genitals (private parts) with your normal soap.             6.  Wash thoroughly, paying special attention to the area where your surgery  will be performed.  7.  Thoroughly rinse your body with warm water from the neck down.  8.  DO NOT shower/wash with your normal soap after using and rinsing off  the CHG Soap.                9.  Pat yourself dry with a clean towel.            10.  Wear clean pajamas.            11.  Place clean sheets on your bed the night of your first shower and do not  sleep with pets. Day of Surgery : Do not apply any lotions/deodorants the morning of surgery.  Please wear clean clothes to the hospital/surgery center.  FAILURE TO FOLLOW THESE INSTRUCTIONS MAY RESULT IN THE CANCELLATION OF YOUR SURGERY PATIENT SIGNATURE_________________________________  NURSE SIGNATURE__________________________________  ________________________________________________________________________  WHAT IS A BLOOD TRANSFUSION? Blood Transfusion Information  A transfusion is the replacement of blood or some of its parts. Blood is made up of multiple cells which provide different functions.  Red blood cells carry oxygen and are used for blood loss replacement.  White blood cells fight against  infection.  Platelets control bleeding.  Plasma helps clot blood.  Other blood products are available for specialized needs, such as hemophilia or other clotting disorders. BEFORE THE TRANSFUSION  Who gives blood for transfusions?   Healthy volunteers who are fully evaluated to make sure their blood is safe. This is blood bank blood. Transfusion therapy is the safest it has ever been in the practice of medicine. Before blood is taken from a donor, a complete history is taken to make sure that person has no history of diseases nor engages in risky social behavior (examples are intravenous drug use or sexual activity with multiple partners). The donor's travel history is screened to minimize risk of transmitting infections, such as malaria. The donated blood is tested for signs of infectious diseases, such as HIV and hepatitis. The blood is then tested to be sure it is compatible with you in order to minimize the chance of a transfusion reaction. If you or a relative donates blood, this is often done in anticipation of surgery and is not appropriate for emergency situations. It takes many days to process the donated blood. RISKS AND COMPLICATIONS Although transfusion therapy is very safe and saves many lives, the main dangers of transfusion include:   Getting an infectious disease.  Developing a transfusion reaction. This is an allergic reaction to something in the blood you were given. Every precaution is taken to prevent this. The decision to have a blood transfusion has been considered carefully by your caregiver before blood is given. Blood is not given unless the benefits outweigh the risks. AFTER THE TRANSFUSION  Right after receiving a blood transfusion, you will usually feel much better and more energetic. This is especially  true if your red blood cells have gotten low (anemic). The transfusion raises the level of the red blood cells which carry oxygen, and this usually causes an energy  increase.  The nurse administering the transfusion will monitor you carefully for complications. HOME CARE INSTRUCTIONS  No special instructions are needed after a transfusion. You may find your energy is better. Speak with your caregiver about any limitations on activity for underlying diseases you may have. SEEK MEDICAL CARE IF:   Your condition is not improving after your transfusion.  You develop redness or irritation at the intravenous (IV) site. SEEK IMMEDIATE MEDICAL CARE IF:  Any of the following symptoms occur over the next 12 hours:  Shaking chills.  You have a temperature by mouth above 102 F (38.9 C), not controlled by medicine.  Chest, back, or muscle pain.  People around you feel you are not acting correctly or are confused.  Shortness of breath or difficulty breathing.  Dizziness and fainting.  You get a rash or develop hives.  You have a decrease in urine output.  Your urine turns a dark color or changes to pink, red, or brown. Any of the following symptoms occur over the next 10 days:  You have a temperature by mouth above 102 F (38.9 C), not controlled by medicine.  Shortness of breath.  Weakness after normal activity.  The white part of the eye turns yellow (jaundice).  You have a decrease in the amount of urine or are urinating less often.  Your urine turns a dark color or changes to pink, red, or brown. Document Released: 11/24/2000 Document Revised: 02/19/2012 Document Reviewed: 07/13/2008 ExitCare Patient Information 2014 [place].  _______________________________________________________________________  Incentive Spirometer  An incentive spirometer is a tool that can help keep your lungs clear and active. This tool measures how well you are filling your lungs with each breath. Taking long deep breaths may help reverse or decrease the chance of developing breathing (pulmonary) problems (especially infection) following:  A long  period of time when you are unable to move or be active. BEFORE THE PROCEDURE   If the spirometer includes an indicator to show your best effort, your nurse or respiratory therapist will set it to a desired goal.  If possible, sit up straight or lean slightly forward. Try not to slouch.  Hold the incentive spirometer in an upright position. INSTRUCTIONS FOR USE  1. Sit on the edge of your bed if possible, or sit up as far as you can in bed or on a chair. 2. Hold the incentive spirometer in an upright position. 3. Breathe out normally. 4. Place the mouthpiece in your mouth and seal your lips tightly around it. 5. Breathe in slowly and as deeply as possible, raising the piston or the ball toward the top of the column. 6. Hold your breath for 3-5 seconds or for as long as possible. Allow the piston or ball to fall to the bottom of the column. 7. Remove the mouthpiece from your mouth and breathe out normally. 8. Rest for a few seconds and repeat Steps 1 through 7 at least 10 times every 1-2 hours when you are awake. Take your time and take a few normal breaths between deep breaths. 9. The spirometer may include an indicator to show your best effort. Use the indicator as a goal to work toward during each repetition. 10. After each set of 10 deep breaths, practice coughing to be sure your lungs are clear. If you have  an incision (the cut made at the time of surgery), support your incision when coughing by placing a pillow or rolled up towels firmly against it. Once you are able to get out of bed, walk around indoors and cough well. You may stop using the incentive spirometer when instructed by your caregiver.  RISKS AND COMPLICATIONS  Take your time so you do not get dizzy or light-headed.  If you are in pain, you may need to take or ask for pain medication before doing incentive spirometry. It is harder to take a deep breath if you are having pain. AFTER USE  Rest and breathe slowly and  easily.  It can be helpful to keep track of a log of your progress. Your caregiver can provide you with a simple table to help with this. If you are using the spirometer at home, follow these instructions: [place] IF:   You are having difficultly using the spirometer.  You have trouble using the spirometer as often as instructed.  Your pain medication is not giving enough relief while using the spirometer.  You develop fever of 100.5 F (38.1 C) or higher. SEEK IMMEDIATE MEDICAL CARE IF:   You cough up bloody sputum that had not been present before.  You develop fever of 102 F (38.9 C) or greater.  You develop worsening pain at or near the incision site. MAKE SURE YOU:   Understand these instructions.  Will watch your condition.  Will get help right away if you are not doing well or get worse. Document Released: 04/09/2007 Document Revised: 02/19/2012 Document Reviewed: 06/10/2007 [facility] Patient Information 2014 [place], [place].   ________________________________________________________________________

## 2019-01-14 NOTE — Patient Instructions (Signed)
Medication Instructions:  Your physician has recommended you make the following change in your medication:  1. CONTINUE TAKING LOSARTAN 25 MG DAILY.   Refills have been sent in to your pharmacy for your Cardiac medications.  If you need a refill on your cardiac medications before your next appointment, please call your pharmacy.   Lab work: NONE  If you have labs (blood work) drawn today and your tests are completely normal, you will receive your results only by: Marland Kitchen MyChart Message (if you have MyChart) OR . A paper copy in the mail If you have any lab test that is abnormal or we need to change your treatment, we will call you to review the results.  Testing/Procedures: NONE  Follow-Up: At [facility], you and your health needs are our priority.  As part of our continuing mission to provide you with exceptional heart care, we have created designated Provider Care Teams.  These Care Teams include your primary Cardiologist (physician) and Advanced Practice Providers (APPs -  Physician Assistants and Nurse Practitioners) who all work together to provide you with the care you need, when you need it. . You will need a follow up appointment in:  3 months.  You may see Dietrich Pates, MD or one of the following Advanced Practice Providers on your designated Care Team: . Tereso Newcomer, PA-C . Vin Bhagat, PA-C . Berton Bon, NP  Any Other Special Instructions Will Be Listed Below (If Applicable).

## 2019-01-15 ENCOUNTER — Encounter (HOSPITAL_COMMUNITY): Payer: Self-pay

## 2019-01-15 ENCOUNTER — Encounter (HOSPITAL_COMMUNITY)
Admission: RE | Admit: 2019-01-15 | Discharge: 2019-01-15 | Disposition: A | Discharge status: Home / Self Care | Payer: BLUE CROSS/BLUE SHIELD | Source: Ambulatory Visit | Attending: Orthopedic Surgery | Admitting: Orthopedic Surgery

## 2019-01-15 ENCOUNTER — Other Ambulatory Visit: Payer: Self-pay

## 2019-01-15 DIAGNOSIS — Z01812 Encounter for preprocedural laboratory examination: Secondary | ICD-10-CM | POA: Diagnosis not present

## 2019-01-15 HISTORY — DX: Other complications of anesthesia, initial encounter: T88.59XA

## 2019-01-15 HISTORY — DX: Other specified postprocedural states: Z98.890

## 2019-01-15 HISTORY — DX: Family history of other specified conditions: Z84.89

## 2019-01-15 HISTORY — DX: Adverse effect of unspecified anesthetic, initial encounter: T41.45XA

## 2019-01-15 HISTORY — DX: Anemia, unspecified: D64.9

## 2019-01-15 HISTORY — DX: Palpitations: R00.2

## 2019-01-15 HISTORY — DX: Nausea with vomiting, unspecified: R11.2

## 2019-01-15 LAB — SURGICAL PCR SCREEN
MRSA, PCR: NEGATIVE
Staphylococcus aureus: NEGATIVE

## 2019-01-15 LAB — CBC
HCT: 39.6 % (ref 36.0–46.0)
Hemoglobin: 12.4 g/dL (ref 12.0–15.0)
MCH: 30 pg (ref 26.0–34.0)
MCHC: 31.3 g/dL (ref 30.0–36.0)
MCV: 95.7 fL (ref 80.0–100.0)
Platelets: 210 10*3/uL (ref 150–400)
RBC: 4.14 MIL/uL (ref 3.87–5.11)
RDW: 14.2 % (ref 11.5–15.5)
WBC: 5.3 10*3/uL (ref 4.0–10.5)
nRBC: 0 % (ref 0.0–0.2)

## 2019-01-15 LAB — ABO/RH: ABO/RH(D): A POS

## 2019-01-15 LAB — BASIC METABOLIC PANEL
Anion gap: 7 (ref 5–15)
BUN: 13 mg/dL (ref 6–20)
CO2: 29 mmol/L (ref 22–32)
Calcium: 9 mg/dL (ref 8.9–10.3)
Chloride: 107 mmol/L (ref 98–111)
Creatinine, Ser: 0.57 mg/dL (ref 0.44–1.00)
GFR calc Af Amer: >60 mL/min (ref >60)
Glucose, Bld: 84 mg/dL (ref 70–99)
Potassium: 3.4 mmol/L — ABNORMAL LOW (ref 3.5–5.1)
Sodium: 143 mmol/L (ref 135–145)

## 2019-01-15 NOTE — H&P (Signed)
TOTAL HIP REVISION ADMISSION H&P  Patient is admitted for right revision total hip arthroplasty.  Subjective:  Chief Complaint:   Right hip pain s/p THA  HPI: [name] [name], 58 y.o. female, has a history of pain and functional disability in the right hip due to failed previous hip arthroplasty and patient has failed non-surgical conservative treatments for greater than 12 weeks to include NSAID's and/or analgesics and activity modification. The indications for the revision total hip arthroplasty are progressive or substantial periprosthetic bone loss and bearing surface wear leading to  symptomatic synovitis.  Onset of symptoms was gradual starting 1+ years ago with gradually worsening course since that time.  Prior procedures on the right hip include arthroplasty.  Patient currently rates pain in the right hip at 5 out of 10 with activity.  There is night pain, worsening of pain with activity and weight bearing, trendelenberg gait, pain that interfers with activities of daily living and pain with passive range of motion. Patient has evidence of prosthetic loosening by imaging studies.  This condition presents safety issues increasing the risk of falls.  There is no current active infection.   Risks, benefits and expectations were discussed with the patient.  Risks including but not limited to the risk of anesthesia, blood clots, nerve damage, blood vessel damage, failure of the prosthesis, infection and up to and including death.  Patient understand the risks, benefits and expectations and wishes to proceed with surgery.   PCP: Anne Ng, NP  D/C Plans:       Home / SNF  Post-op Meds:       No Rx given  Tranexamic Acid:      To be given - IV   Decadron:      Is to be given  FYI:      ASA  Tramadol or Dilaudid (codeine allergy)  DME:   Rx given for - 3-n-1, has a RW  PT:   OPPT  /  No PT   Patient Active Problem List   Diagnosis Date Noted  . HLD (hyperlipidemia) 01/14/2019  .  Pharyngitis 07/12/2018  . Degeneration of lumbar intervertebral disc 06/05/2018  . Sacroiliac joint pain 06/05/2018  . Trochanteric bursitis of right hip 06/05/2018  . Pain in joint of right hip 06/04/2018  . Obese 11/24/2015  . Multinodular goiter   . Unspecified vitamin D deficiency   . Essential hypertension 11/10/2009  . ARTHRITIS 11/10/2009  . Dyslipidemia 11/09/2009  . ASTHMA 11/09/2009  . ANXIETY 03/18/2008  . EMPHYSEMA, MILD 03/18/2008  . DEGENERATIVE JOINT DISEASE 03/18/2008   Past Medical History:  Diagnosis Date  . Anemia    hx of years ago  . ANXIETY    no meds  . ARTHRITIS   . ASTHMA   . Complication of anesthesia   . DEGENERATIVE JOINT DISEASE   . EMPHYSEMA, MILD    pt. denies at preop  . Family history of adverse reaction to anesthesia    MOM PONV  . GALLBLADDER DISEASE, HX OF   . HYPERLIPIDEMIA   . HYPERTENSION   . Multinodular goiter 05/2001 dx   s/p bx: benign  . Palpitations   . PONV (postoperative nausea and vomiting)   . TOBACCO ABUSE    hx of; quit 1/22016  . Vitamin D deficiency     Past Surgical History:  Procedure Laterality Date  . BREAST BIOPSY    . CHOLECYSTECTOMY  2008  . JOINT REPLACEMENT Right 1998   total hip  .  thyroid biopsy  2002    No current facility-administered medications for this encounter.    Current Outpatient Medications  Medication Sig Dispense Refill Last Dose  . Ascorbic Acid (VITAMIN C) 250 MG CHEW Chew 250 mg by mouth daily.   Taking  . Calcium Carbonate-Vitamin D (SM CALCIUM 500/VITAMIN D3 PO) Take 2 tablets by mouth daily.   Taking  . Multiple Vitamin (MULTIVITAMIN) tablet Take 1 tablet by mouth daily.     Taking  . Multiple Vitamins-Minerals (AIRBORNE PO) Take 2 tablets by mouth daily.   Taking  . neomycin-bacitracin-polymyxin (NEOSPORIN) ointment Apply 1 application topically daily as needed for wound care.   Taking  . tetrahydrozoline (VISINE) 0.05 % ophthalmic solution Place 1 drop into both eyes daily as  needed (for dry eyes).   Taking  . aspirin 81 MG tablet Take 81 mg by mouth daily.     Taking  . hydrochlorothiazide (HYDRODIURIL) 12.5 MG tablet Take 1 tablet (12.5 mg total) by mouth daily. Please keep upcoming appt for future refills. Thank you. 90 tablet 3   . losartan (COZAAR) 25 MG tablet Take 1 tablet (25 mg total) by mouth daily. 90 tablet 3   . rosuvastatin (CRESTOR) 20 MG tablet TAKE 1 TABLET BY MOUTH EVERY DAY 30 tablet 11    Allergies  Allergen Reactions  . Codeine Nausea And Vomiting and Other (See Comments)    Makes pt sick    Social History   Tobacco Use  . Smoking status: Former Smoker    Packs/day: 1.00    Years: 15.00    Pack years: 15.00    Types: Cigarettes    Last attempt to quit: 12/11/2014    Years since quitting: 4.0  . Smokeless tobacco: Never Used  Substance Use Topics  . Alcohol use: Yes    Alcohol/week: 0.0 standard drinks    Comment: occasional/ social    Family History  Problem Relation Age of Onset  . Hypertension Mother   . Hyperlipidemia Mother   . Stroke Mother 42       L HP  . Alcohol abuse Father 69       sober since age 26  . Arthritis Father   . Hypertension Father   . Hyperlipidemia Father   . Lung cancer Father 13       smoker  . Lung cancer Paternal Grandmother   . Rectal cancer Maternal Grandmother       Review of Systems  Constitutional: Negative.   HENT: Positive for tinnitus.   Eyes: Negative.   Respiratory: Negative.   Cardiovascular: Negative.   Gastrointestinal: Positive for heartburn.  Genitourinary: Positive for frequency.  Musculoskeletal: Positive for back pain and joint pain.  Skin: Negative.   Neurological: Negative.   Endo/Heme/Allergies: Negative.   Psychiatric/Behavioral: The patient is nervous/anxious.     Objective:  Physical Exam  Constitutional: She is oriented to person, place, and time. She appears well-developed.  HENT:  Head: Normocephalic.  Eyes: Pupils are equal, round, and reactive to  light.  Neck: Neck supple. No JVD present. No tracheal deviation present. No thyromegaly present.  Cardiovascular: Normal rate, regular rhythm and intact distal pulses.  Respiratory: Effort normal and breath sounds normal. No respiratory distress. She has no wheezes.  GI: Soft. There is no abdominal tenderness. There is no guarding.  Musculoskeletal:     Right hip: She exhibits decreased range of motion, decreased strength, tenderness and bony tenderness. She exhibits no deformity and no laceration (healed previous incision).  Lymphadenopathy:    She has no cervical adenopathy.  Neurological: She is alert and oriented to person, place, and time. A sensory deficit (some numbness/tingling of feet, R > L) is present.  Skin: Skin is warm and dry.  Psychiatric: She has a normal mood and affect.    Vital signs in last 24 hours: Temp:  [98.4 F (36.9 C)] 98.4 F (36.9 C) (02/05 1014) Pulse Rate:  [65] 65 (02/05 1014) Resp:  [16] 16 (02/05 1014) BP: (119)/(67) 119/67 (02/05 1014) SpO2:  [99 %] 99 % (02/05 1014) Weight:  [89.8 kg] 89.8 kg (02/05 1014)   Labs:   Estimated body mass index is 33.99 kg/m as calculated from the following:   Height as of 01/15/19: 5\' 4"  (1.626 m).   Weight as of 01/15/19: 89.8 kg.  Imaging Review:  Plain radiographs demonstrate previous THA of the right hip. There is evidence of loosening and bone loss.  The bone quality appears to be good for age and reported activity level.     Assessment/Plan:  Right hip with failed previous arthroplasty.  The patient history, physical examination, clinical judgement of the provider and imaging studies are consistent with failure of the right hip, previous total hip arthroplasty. Revision total hip arthroplasty is deemed medically necessary. The treatment options including medical management, injection therapy, arthroscopy and arthroplasty were discussed at length. The risks and benefits of total hip arthroplasty were  presented and reviewed. The risks due to aseptic loosening, infection, stiffness, dislocation/subluxation,  thromboembolic complications and other imponderables were discussed.  The patient acknowledged the explanation, agreed to proceed with the plan and consent was signed. Patient is being admitted for inpatient treatment for surgery, pain control, PT, OT, prophylactic antibiotics, VTE prophylaxis, progressive ambulation and ADL's and discharge planning. The patient is planning to be discharged home.     Anastasio AuerbachMatthew S. Miley Blanchett   PA-C  01/15/2019, 2:49 PM

## 2019-01-15 NOTE — Progress Notes (Addendum)
Clearance [name] Mcdaniel, NP cardiology in epic 01-14-19  EKG 01-14-19 epic . In cardiology note noted to be NSR  Echo 02-01-18 epic

## 2019-01-16 ENCOUNTER — Telehealth: Payer: Self-pay | Admitting: Nurse Practitioner

## 2019-01-16 ENCOUNTER — Ambulatory Visit (INDEPENDENT_AMBULATORY_CARE_PROVIDER_SITE_OTHER): Payer: BLUE CROSS/BLUE SHIELD | Admitting: Nurse Practitioner

## 2019-01-16 ENCOUNTER — Encounter: Payer: Self-pay | Admitting: Nurse Practitioner

## 2019-01-16 VITALS — BP 112/70 | HR 70 | Temp 98.7°F | Ht 64.0 in | Wt 201.8 lb

## 2019-01-16 DIAGNOSIS — Z01818 Encounter for other preprocedural examination: Secondary | ICD-10-CM

## 2019-01-16 NOTE — Patient Instructions (Addendum)
Will complete form after review of lab results.  Wish you the best and rapid recovery from surgery.

## 2019-01-16 NOTE — Progress Notes (Signed)
Subjective:  Patient ID: [name] [name], female    DOB: Oct 15, 1961  Age: 58 y.o. MRN: [id number]  CC: Medical Clearance (pt is here for hip replament 01/23/2019. heart and hip doctor is clear the pt. )  HPI  Ms. Antes is here for medical preop clearance for revision of right total hip arthroplasty. Surgery is schedule for 01/23/2019. She already completed cardiovascular clearance with Dr. Julien Girt. It was determined she was low risk. She denies any previous hx or FHx of adverse reaction to anesthesia. She does not have form that needs to be completed.  Reviewed past Medical, Social and Family history today.  Outpatient Medications Prior to Visit  Medication Sig Dispense Refill  . Ascorbic Acid (VITAMIN C) 250 MG CHEW Chew 250 mg by mouth daily.    Marland Kitchen aspirin 81 MG tablet Take 81 mg by mouth daily.      Marland Kitchen CALCIUM PO Take by mouth.    . hydrochlorothiazide (HYDRODIURIL) 12.5 MG tablet Take 1 tablet (12.5 mg total) by mouth daily. Please keep upcoming appt for future refills. Thank you. 90 tablet 3  . losartan (COZAAR) 25 MG tablet Take 1 tablet (25 mg total) by mouth daily. 90 tablet 3  . Multiple Vitamin (MULTIVITAMIN) tablet Take 1 tablet by mouth daily.      Marland Kitchen neomycin-bacitracin-polymyxin (NEOSPORIN) ointment Apply 1 application topically daily as needed for wound care.    . rosuvastatin (CRESTOR) 20 MG tablet TAKE 1 TABLET BY MOUTH EVERY DAY 30 tablet 11  . tetrahydrozoline (VISINE) 0.05 % ophthalmic solution Place 1 drop into both eyes daily as needed (for dry eyes).    . Calcium Carbonate-Vitamin D (SM CALCIUM 500/VITAMIN D3 PO) Take 2 tablets by mouth daily.    . Multiple Vitamins-Minerals (AIRBORNE PO) Take 2 tablets by mouth daily.     No facility-administered medications prior to visit.     ROS See HPI  Objective:  BP 112/70   Pulse 70   Temp 98.7 F (37.1 C) (Oral)   Ht 5\' 4"  (1.626 m)   Wt 201 lb 12.8 oz (91.5 kg)   SpO2 96%   BMI 34.64 kg/m   BP Readings from Last 3  Encounters:  01/16/19 112/70  01/15/19 119/67  01/14/19 (!) 100/42    Wt Readings from Last 3 Encounters:  01/16/19 201 lb 12.8 oz (91.5 kg)  01/15/19 198 lb (89.8 kg)  01/14/19 200 lb 6.4 oz (90.9 kg)    Physical Exam Vitals signs reviewed.  HENT:     Head:     Jaw: No trismus.     Salivary Glands: Right salivary gland is not diffusely enlarged or tender. Left salivary gland is not diffusely enlarged.     Nose: Nose normal.     Mouth/Throat:     Mouth: Mucous membranes are moist.     Pharynx: Oropharynx is clear. Uvula midline.     Tonsils: No tonsillar exudate or tonsillar abscesses.  Neck:     Musculoskeletal: Normal range of motion and neck supple.  Cardiovascular:     Rate and Rhythm: Normal rate and regular rhythm.     Pulses: Normal pulses.     Heart sounds: Normal heart sounds.  Pulmonary:     Effort: Pulmonary effort is normal.     Breath sounds: Normal breath sounds.  Musculoskeletal:     Right lower leg: No edema.     Left lower leg: No edema.  Lymphadenopathy:     Cervical: No cervical adenopathy.  Neurological:     Mental Status: She is alert and oriented to person, place, and time.     Lab Results  Component Value Date   WBC 5.3 01/15/2019   HGB 12.4 01/15/2019   HCT 39.6 01/15/2019   PLT 210 01/15/2019   GLUCOSE 84 01/15/2019   CHOL 194 07/30/2017   TRIG 225.0 (H) 07/30/2017   HDL 44.00 07/30/2017   LDLDIRECT 126.0 07/30/2017   LDLCALC 87 03/03/2013   ALT 13 07/30/2017   AST 14 07/30/2017   NA 143 01/15/2019   K 3.4 (L) 01/15/2019   CL 107 01/15/2019   CREATININE 0.57 01/15/2019   BUN 13 01/15/2019   CO2 29 01/15/2019   TSH 0.78 07/30/2017   INR 1.0 07/01/2007   HGBA1C 5.7 10/09/2011    Assessment & Plan:   Allieanna was seen today for medical clearance.  Diagnoses and all orders for this visit:  Preoperative clearance -     Hepatic function panel; Future -     TSH; Future -     Hemoglobin A1c; Future -     Urinalysis w microscopic  + reflex cultur; Future   I am having Fontella L. Haueter maintain her aspirin, multivitamin, Calcium Carbonate-Vitamin D (SM CALCIUM 500/VITAMIN D3 PO), Vitamin C, Multiple Vitamins-Minerals (AIRBORNE PO), tetrahydrozoline, neomycin-bacitracin-polymyxin, hydrochlorothiazide, losartan, rosuvastatin, and CALCIUM PO.  No orders of the defined types were placed in this encounter.   Problem List Items Addressed This Visit    None    Visit Diagnoses    Preoperative clearance    -  Primary   Relevant Orders   Hepatic function panel   TSH   Hemoglobin A1c   Urinalysis w microscopic + reflex cultur       Follow-up: Return if symptoms worsen or fail to improve.  Alysia Penna, NP

## 2019-01-17 ENCOUNTER — Other Ambulatory Visit (INDEPENDENT_AMBULATORY_CARE_PROVIDER_SITE_OTHER): Payer: BLUE CROSS/BLUE SHIELD

## 2019-01-17 ENCOUNTER — Encounter: Payer: Self-pay | Admitting: Nurse Practitioner

## 2019-01-17 DIAGNOSIS — Z01818 Encounter for other preprocedural examination: Secondary | ICD-10-CM | POA: Diagnosis not present

## 2019-01-17 LAB — HEMOGLOBIN A1C: Hgb A1c MFr Bld: 5.6 % (ref 4.6–6.5)

## 2019-01-17 LAB — HEPATIC FUNCTION PANEL
ALT: 25 U/L (ref 0–35)
AST: 19 U/L (ref 0–37)
Albumin: 4.3 g/dL (ref 3.5–5.2)
Alkaline Phosphatase: 72 U/L (ref 39–117)
Bilirubin, Direct: 0.1 mg/dL (ref 0.0–0.3)
Total Bilirubin: 0.6 mg/dL (ref 0.2–1.2)
Total Protein: 6.2 g/dL (ref 6.0–8.3)

## 2019-01-17 LAB — TSH: TSH: 0.69 u[IU]/mL (ref 0.35–4.50)

## 2019-01-17 NOTE — Telephone Encounter (Signed)
Pt is aware.  

## 2019-01-19 LAB — URINALYSIS W MICROSCOPIC + REFLEX CULTURE
Bilirubin Urine: NEGATIVE
Glucose, UA: NEGATIVE
Hyaline Cast: NONE SEEN /LPF
Ketones, ur: NEGATIVE
Nitrites, Initial: NEGATIVE
PROTEIN: NEGATIVE
Specific Gravity, Urine: 1.025 (ref 1.001–1.03)
pH: 5.5 (ref 5.0–8.0)

## 2019-01-19 LAB — URINE CULTURE
MICRO NUMBER:: 171034
SPECIMEN QUALITY: ADEQUATE

## 2019-01-19 LAB — CULTURE INDICATED

## 2019-01-22 NOTE — Anesthesia Preprocedure Evaluation (Addendum)
Anesthesia Evaluation  Patient identified by MRN, date of birth, ID band Patient awake    Reviewed: Allergy & Precautions, NPO status , Patient's Chart, lab work & pertinent test results  History of Anesthesia Complications (+) PONV  Airway Mallampati: II  TM Distance: >3 FB Neck ROM: Full    Dental  (+) Dental Advisory Given   Pulmonary COPD, Recent URI , former smoker (quit 2016),    breath sounds clear to auscultation       Cardiovascular hypertension, Pt. on medications (-) angina Rhythm:Regular Rate:Normal  2/19 ECHO: EF 60-65%, mild TR   Neuro/Psych negative neurological ROS     GI/Hepatic Neg liver ROS, GERD  Controlled,  Endo/Other  Morbid obesity  Renal/GU negative Renal ROS     Musculoskeletal  (+) Arthritis , Osteoarthritis,    Abdominal (+) + obese,   Peds  Hematology negative hematology ROS (+) plt 210k   Anesthesia Other Findings   Reproductive/Obstetrics                            Anesthesia Physical Anesthesia Plan  ASA: II  Anesthesia Plan: Spinal   Post-op Pain Management:    Induction:   PONV Risk Score and Plan: 2 and Ondansetron, Dexamethasone and Scopolamine patch - Pre-op  Airway Management Planned: Natural Airway and Simple Face Mask  Additional Equipment:   Intra-op Plan:   Post-operative Plan:   Informed Consent: I have reviewed the patients History and Physical, chart, labs and discussed the procedure including the risks, benefits and alternatives for the proposed anesthesia with the patient or authorized representative who has indicated his/her understanding and acceptance.     Dental advisory given  Plan Discussed with: CRNA and Surgeon  Anesthesia Plan Comments: (Plan routine monitors, SAB)       Anesthesia Quick Evaluation

## 2019-01-23 ENCOUNTER — Encounter (HOSPITAL_COMMUNITY): Payer: Self-pay | Admitting: Emergency Medicine

## 2019-01-23 ENCOUNTER — Encounter (HOSPITAL_COMMUNITY)
Admission: RE | Disposition: A | Discharge status: Home Health | Payer: Self-pay | Source: Home / Self Care | Attending: Orthopedic Surgery

## 2019-01-23 ENCOUNTER — Inpatient Hospital Stay (HOSPITAL_COMMUNITY): Payer: BLUE CROSS/BLUE SHIELD | Admitting: Physician Assistant

## 2019-01-23 ENCOUNTER — Inpatient Hospital Stay (HOSPITAL_COMMUNITY): Payer: BLUE CROSS/BLUE SHIELD | Admitting: Anesthesiology

## 2019-01-23 ENCOUNTER — Inpatient Hospital Stay (HOSPITAL_COMMUNITY)
Admission: RE | Admit: 2019-01-23 | Discharge: 2019-01-26 | DRG: 468 | Disposition: A | Discharge status: Home Health | Payer: BLUE CROSS/BLUE SHIELD | Attending: Orthopedic Surgery | Admitting: Orthopedic Surgery

## 2019-01-23 ENCOUNTER — Inpatient Hospital Stay (HOSPITAL_COMMUNITY): Payer: BLUE CROSS/BLUE SHIELD

## 2019-01-23 ENCOUNTER — Other Ambulatory Visit: Payer: Self-pay

## 2019-01-23 DIAGNOSIS — Z471 Aftercare following joint replacement surgery: Secondary | ICD-10-CM | POA: Diagnosis not present

## 2019-01-23 DIAGNOSIS — Z6834 Body mass index (BMI) 34.0-34.9, adult: Secondary | ICD-10-CM

## 2019-01-23 DIAGNOSIS — T8451XA Infection and inflammatory reaction due to internal right hip prosthesis, initial encounter: Secondary | ICD-10-CM | POA: Diagnosis not present

## 2019-01-23 DIAGNOSIS — Z87891 Personal history of nicotine dependence: Secondary | ICD-10-CM | POA: Diagnosis not present

## 2019-01-23 DIAGNOSIS — Z811 Family history of alcohol abuse and dependence: Secondary | ICD-10-CM | POA: Diagnosis not present

## 2019-01-23 DIAGNOSIS — Z800 Family history of malignant neoplasm of digestive organs: Secondary | ICD-10-CM

## 2019-01-23 DIAGNOSIS — E559 Vitamin D deficiency, unspecified: Secondary | ICD-10-CM | POA: Diagnosis not present

## 2019-01-23 DIAGNOSIS — J439 Emphysema, unspecified: Secondary | ICD-10-CM | POA: Diagnosis not present

## 2019-01-23 DIAGNOSIS — Z96649 Presence of unspecified artificial hip joint: Secondary | ICD-10-CM

## 2019-01-23 DIAGNOSIS — Z885 Allergy status to narcotic agent status: Secondary | ICD-10-CM

## 2019-01-23 DIAGNOSIS — I10 Essential (primary) hypertension: Secondary | ICD-10-CM | POA: Diagnosis not present

## 2019-01-23 DIAGNOSIS — M25551 Pain in right hip: Secondary | ICD-10-CM | POA: Diagnosis present

## 2019-01-23 DIAGNOSIS — Z8249 Family history of ischemic heart disease and other diseases of the circulatory system: Secondary | ICD-10-CM | POA: Diagnosis not present

## 2019-01-23 DIAGNOSIS — E785 Hyperlipidemia, unspecified: Secondary | ICD-10-CM | POA: Diagnosis present

## 2019-01-23 DIAGNOSIS — Z8261 Family history of arthritis: Secondary | ICD-10-CM | POA: Diagnosis not present

## 2019-01-23 DIAGNOSIS — M8568 Other cyst of bone, other site: Secondary | ICD-10-CM | POA: Diagnosis not present

## 2019-01-23 DIAGNOSIS — Z7982 Long term (current) use of aspirin: Secondary | ICD-10-CM

## 2019-01-23 DIAGNOSIS — Z8349 Family history of other endocrine, nutritional and metabolic diseases: Secondary | ICD-10-CM | POA: Diagnosis not present

## 2019-01-23 DIAGNOSIS — Z79899 Other long term (current) drug therapy: Secondary | ICD-10-CM | POA: Diagnosis not present

## 2019-01-23 DIAGNOSIS — Z823 Family history of stroke: Secondary | ICD-10-CM | POA: Diagnosis not present

## 2019-01-23 DIAGNOSIS — Z801 Family history of malignant neoplasm of trachea, bronchus and lung: Secondary | ICD-10-CM

## 2019-01-23 DIAGNOSIS — Y792 Prosthetic and other implants, materials and accessory orthopedic devices associated with adverse incidents: Secondary | ICD-10-CM | POA: Diagnosis present

## 2019-01-23 DIAGNOSIS — T84090A Other mechanical complication of internal right hip prosthesis, initial encounter: Principal | ICD-10-CM | POA: Diagnosis present

## 2019-01-23 DIAGNOSIS — Z96641 Presence of right artificial hip joint: Secondary | ICD-10-CM | POA: Diagnosis not present

## 2019-01-23 HISTORY — PX: TOTAL HIP REVISION: SHX763

## 2019-01-23 LAB — TYPE AND SCREEN
ABO/RH(D): A POS
Antibody Screen: NEGATIVE

## 2019-01-23 SURGERY — TOTAL HIP REVISION
Anesthesia: Spinal | Site: Hip | Laterality: Right

## 2019-01-23 MED ORDER — FENTANYL CITRATE (PF) 100 MCG/2ML IJ SOLN
INTRAMUSCULAR | Status: AC
  Filled 2019-01-23: qty 2

## 2019-01-23 MED ORDER — ASPIRIN 81 MG PO CHEW
81.0000 mg | CHEWABLE_TABLET | Freq: Two times a day (BID) | ORAL | Status: DC
  Administered 2019-01-23 – 2019-01-26 (×6): 81 mg via ORAL
  Filled 2019-01-23 (×6): qty 1

## 2019-01-23 MED ORDER — PROPOFOL 500 MG/50ML IV EMUL
INTRAVENOUS | Status: DC | PRN
  Administered 2019-01-23: 40 ug/kg/min via INTRAVENOUS

## 2019-01-23 MED ORDER — HYDROMORPHONE HCL 2 MG PO TABS
4.0000 mg | ORAL_TABLET | ORAL | Status: DC | PRN
  Administered 2019-01-23 – 2019-01-25 (×4): 4 mg via ORAL
  Filled 2019-01-23 (×6): qty 2

## 2019-01-23 MED ORDER — MAGNESIUM CITRATE PO SOLN: 1.0000 | Freq: Once | ORAL | Status: DC | PRN

## 2019-01-23 MED ORDER — SODIUM CHLORIDE 0.9 % IV SOLN
INTRAVENOUS | Status: DC
  Administered 2019-01-23 (×2): via INTRAVENOUS

## 2019-01-23 MED ORDER — BISACODYL 10 MG RE SUPP: 10.0000 mg | Freq: Every day | RECTAL | Status: DC | PRN

## 2019-01-23 MED ORDER — ALUM & MAG HYDROXIDE-SIMETH 200-200-20 MG/5ML PO SUSP
15.0000 mL | ORAL | Status: DC | PRN
  Administered 2019-01-26: 15 mL via ORAL
  Filled 2019-01-23: qty 30

## 2019-01-23 MED ORDER — PROPOFOL 10 MG/ML IV BOLUS
INTRAVENOUS | Status: AC
  Filled 2019-01-23: qty 40

## 2019-01-23 MED ORDER — HYDROMORPHONE HCL 1 MG/ML IJ SOLN
INTRAMUSCULAR | Status: AC
  Filled 2019-01-23: qty 1

## 2019-01-23 MED ORDER — METHOCARBAMOL 500 MG IVPB - SIMPLE MED
INTRAVENOUS | Status: AC
  Administered 2019-01-23: 500 mg via INTRAVENOUS
  Filled 2019-01-23: qty 50

## 2019-01-23 MED ORDER — MIDAZOLAM HCL 2 MG/2ML IJ SOLN
INTRAMUSCULAR | Status: DC | PRN
  Administered 2019-01-23: 2 mg via INTRAVENOUS

## 2019-01-23 MED ORDER — DOCUSATE SODIUM 100 MG PO CAPS
100.0000 mg | ORAL_CAPSULE | Freq: Two times a day (BID) | ORAL | Status: DC
  Administered 2019-01-23 – 2019-01-26 (×6): 100 mg via ORAL
  Filled 2019-01-23 (×6): qty 1

## 2019-01-23 MED ORDER — SCOPOLAMINE 1 MG/3DAYS TD PT72
MEDICATED_PATCH | TRANSDERMAL | Status: DC | PRN
  Administered 2019-01-23: 1 via TRANSDERMAL

## 2019-01-23 MED ORDER — SCOPOLAMINE 1 MG/3DAYS TD PT72
MEDICATED_PATCH | TRANSDERMAL | Status: AC
  Filled 2019-01-23: qty 1

## 2019-01-23 MED ORDER — METOCLOPRAMIDE HCL 5 MG/ML IJ SOLN: 5.0000 mg | Freq: Three times a day (TID) | INTRAMUSCULAR | Status: DC | PRN

## 2019-01-23 MED ORDER — CELECOXIB 200 MG PO CAPS
200.0000 mg | ORAL_CAPSULE | Freq: Two times a day (BID) | ORAL | Status: DC
  Administered 2019-01-23 – 2019-01-26 (×7): 200 mg via ORAL
  Filled 2019-01-23 (×7): qty 1

## 2019-01-23 MED ORDER — PROPOFOL 10 MG/ML IV BOLUS
INTRAVENOUS | Status: AC
  Filled 2019-01-23: qty 20

## 2019-01-23 MED ORDER — MIDAZOLAM HCL 2 MG/2ML IJ SOLN
INTRAMUSCULAR | Status: AC
  Filled 2019-01-23: qty 2

## 2019-01-23 MED ORDER — CHLORHEXIDINE GLUCONATE 4 % EX LIQD: 60.0000 mL | Freq: Once | CUTANEOUS | Status: DC

## 2019-01-23 MED ORDER — ONDANSETRON HCL 4 MG/2ML IJ SOLN
4.0000 mg | Freq: Four times a day (QID) | INTRAMUSCULAR | Status: DC | PRN
  Administered 2019-01-23 – 2019-01-24 (×2): 4 mg via INTRAVENOUS
  Filled 2019-01-23 (×2): qty 2

## 2019-01-23 MED ORDER — EPHEDRINE SULFATE-NACL 50-0.9 MG/10ML-% IV SOSY
PREFILLED_SYRINGE | INTRAVENOUS | Status: DC | PRN
  Administered 2019-01-23: 5 mg via INTRAVENOUS
  Administered 2019-01-23 (×2): 10 mg via INTRAVENOUS
  Administered 2019-01-23 (×2): 5 mg via INTRAVENOUS
  Administered 2019-01-23: 10 mg via INTRAVENOUS
  Administered 2019-01-23 (×3): 5 mg via INTRAVENOUS

## 2019-01-23 MED ORDER — EPHEDRINE 5 MG/ML INJ
INTRAVENOUS | Status: AC
  Filled 2019-01-23: qty 20

## 2019-01-23 MED ORDER — METHOCARBAMOL 500 MG IVPB - SIMPLE MED
500.0000 mg | Freq: Four times a day (QID) | INTRAVENOUS | Status: DC | PRN
  Administered 2019-01-23: 500 mg via INTRAVENOUS
  Filled 2019-01-23: qty 50

## 2019-01-23 MED ORDER — HYDROMORPHONE HCL 1 MG/ML IJ SOLN
0.5000 mg | INTRAMUSCULAR | Status: DC | PRN
  Administered 2019-01-23: 1 mg via INTRAVENOUS
  Filled 2019-01-23: qty 1

## 2019-01-23 MED ORDER — LIDOCAINE 2% (20 MG/ML) 5 ML SYRINGE
INTRAMUSCULAR | Status: AC
  Filled 2019-01-23: qty 5

## 2019-01-23 MED ORDER — TRANEXAMIC ACID-NACL 1000-0.7 MG/100ML-% IV SOLN
1000.0000 mg | INTRAVENOUS | Status: AC
  Administered 2019-01-23: 1000 mg via INTRAVENOUS
  Filled 2019-01-23: qty 100

## 2019-01-23 MED ORDER — FERROUS SULFATE 325 (65 FE) MG PO TABS
325.0000 mg | ORAL_TABLET | Freq: Three times a day (TID) | ORAL | Status: DC
  Administered 2019-01-24 – 2019-01-26 (×8): 325 mg via ORAL
  Filled 2019-01-23 (×8): qty 1

## 2019-01-23 MED ORDER — MEPERIDINE HCL 50 MG/ML IJ SOLN: 6.2500 mg | INTRAMUSCULAR | Status: DC | PRN

## 2019-01-23 MED ORDER — HYDROMORPHONE HCL 1 MG/ML IJ SOLN
INTRAMUSCULAR | Status: AC
  Administered 2019-01-23: 0.25 mg via INTRAVENOUS
  Filled 2019-01-23: qty 1

## 2019-01-23 MED ORDER — STERILE WATER FOR IRRIGATION IR SOLN: Status: DC | PRN

## 2019-01-23 MED ORDER — TRANEXAMIC ACID-NACL 1000-0.7 MG/100ML-% IV SOLN
1000.0000 mg | Freq: Once | INTRAVENOUS | Status: AC
  Administered 2019-01-23: 1000 mg via INTRAVENOUS
  Filled 2019-01-23: qty 100

## 2019-01-23 MED ORDER — DEXAMETHASONE SODIUM PHOSPHATE 10 MG/ML IJ SOLN
10.0000 mg | Freq: Once | INTRAMUSCULAR | Status: AC
  Administered 2019-01-24: 10 mg via INTRAVENOUS
  Filled 2019-01-23: qty 1

## 2019-01-23 MED ORDER — ONDANSETRON HCL 4 MG/2ML IJ SOLN
INTRAMUSCULAR | Status: DC | PRN
  Administered 2019-01-23: 4 mg via INTRAVENOUS

## 2019-01-23 MED ORDER — DIPHENHYDRAMINE HCL 12.5 MG/5ML PO ELIX
12.5000 mg | ORAL_SOLUTION | ORAL | Status: DC | PRN
  Administered 2019-01-24: 12.5 mg via ORAL
  Administered 2019-01-25: 25 mg via ORAL
  Filled 2019-01-23 (×2): qty 10

## 2019-01-23 MED ORDER — METHOCARBAMOL 500 MG PO TABS
500.0000 mg | ORAL_TABLET | Freq: Four times a day (QID) | ORAL | Status: DC | PRN
  Administered 2019-01-23 – 2019-01-26 (×7): 500 mg via ORAL
  Filled 2019-01-23 (×7): qty 1

## 2019-01-23 MED ORDER — PROMETHAZINE HCL 25 MG/ML IJ SOLN: 6.2500 mg | INTRAMUSCULAR | Status: DC | PRN

## 2019-01-23 MED ORDER — ONDANSETRON HCL 4 MG/2ML IJ SOLN
INTRAMUSCULAR | Status: AC
  Filled 2019-01-23: qty 2

## 2019-01-23 MED ORDER — METOCLOPRAMIDE HCL 5 MG PO TABS: 5.0000 mg | ORAL_TABLET | Freq: Three times a day (TID) | ORAL | Status: DC | PRN

## 2019-01-23 MED ORDER — BUPIVACAINE IN DEXTROSE 0.75-8.25 % IT SOLN
INTRATHECAL | Status: DC | PRN
  Administered 2019-01-23: 1.8 mL via INTRATHECAL

## 2019-01-23 MED ORDER — PHENOL 1.4 % MT LIQD: 1.0000 | OROMUCOSAL | Status: DC | PRN

## 2019-01-23 MED ORDER — HYDROMORPHONE HCL 2 MG PO TABS
2.0000 mg | ORAL_TABLET | ORAL | Status: DC | PRN
  Administered 2019-01-23 – 2019-01-26 (×7): 2 mg via ORAL
  Filled 2019-01-23 (×5): qty 1

## 2019-01-23 MED ORDER — 0.9 % SODIUM CHLORIDE (POUR BTL) OPTIME
TOPICAL | Status: DC | PRN
  Administered 2019-01-23: 1000 mL

## 2019-01-23 MED ORDER — LOSARTAN POTASSIUM 25 MG PO TABS
25.0000 mg | ORAL_TABLET | Freq: Every day | ORAL | Status: DC
  Administered 2019-01-24 – 2019-01-26 (×3): 25 mg via ORAL
  Filled 2019-01-23 (×3): qty 1

## 2019-01-23 MED ORDER — LACTATED RINGERS IV SOLN
INTRAVENOUS | Status: DC
  Administered 2019-01-23: 06:00:00 via INTRAVENOUS

## 2019-01-23 MED ORDER — MIDAZOLAM HCL 2 MG/2ML IJ SOLN: 0.5000 mg | Freq: Once | INTRAMUSCULAR | Status: DC | PRN

## 2019-01-23 MED ORDER — DEXAMETHASONE SODIUM PHOSPHATE 10 MG/ML IJ SOLN
INTRAMUSCULAR | Status: AC
  Filled 2019-01-23: qty 1

## 2019-01-23 MED ORDER — DEXAMETHASONE SODIUM PHOSPHATE 10 MG/ML IJ SOLN
10.0000 mg | Freq: Once | INTRAMUSCULAR | Status: AC
  Administered 2019-01-23: 10 mg via INTRAVENOUS

## 2019-01-23 MED ORDER — CEFAZOLIN SODIUM-DEXTROSE 2-4 GM/100ML-% IV SOLN
2.0000 g | Freq: Four times a day (QID) | INTRAVENOUS | Status: AC
  Administered 2019-01-23 (×2): 2 g via INTRAVENOUS
  Filled 2019-01-23 (×2): qty 100

## 2019-01-23 MED ORDER — HYDROMORPHONE HCL 1 MG/ML IJ SOLN
0.2500 mg | INTRAMUSCULAR | Status: DC | PRN
  Administered 2019-01-23 (×2): 0.5 mg via INTRAVENOUS
  Administered 2019-01-23 (×2): 0.25 mg via INTRAVENOUS

## 2019-01-23 MED ORDER — ROSUVASTATIN CALCIUM 20 MG PO TABS
20.0000 mg | ORAL_TABLET | Freq: Every day | ORAL | Status: DC
  Administered 2019-01-23 – 2019-01-25 (×3): 20 mg via ORAL
  Filled 2019-01-23 (×3): qty 1

## 2019-01-23 MED ORDER — ONDANSETRON HCL 4 MG PO TABS
4.0000 mg | ORAL_TABLET | Freq: Four times a day (QID) | ORAL | Status: DC | PRN
  Administered 2019-01-25 – 2019-01-26 (×2): 4 mg via ORAL
  Filled 2019-01-23 (×2): qty 1

## 2019-01-23 MED ORDER — FENTANYL CITRATE (PF) 100 MCG/2ML IJ SOLN
INTRAMUSCULAR | Status: DC | PRN
  Administered 2019-01-23 (×3): 50 ug via INTRAVENOUS

## 2019-01-23 MED ORDER — CEFAZOLIN SODIUM-DEXTROSE 2-4 GM/100ML-% IV SOLN
2.0000 g | INTRAVENOUS | Status: AC
  Administered 2019-01-23: 2 g via INTRAVENOUS
  Filled 2019-01-23: qty 100

## 2019-01-23 MED ORDER — ALBUMIN HUMAN 5 % IV SOLN
INTRAVENOUS | Status: AC
  Filled 2019-01-23: qty 250

## 2019-01-23 MED ORDER — POLYETHYLENE GLYCOL 3350 17 G PO PACK
17.0000 g | PACK | Freq: Two times a day (BID) | ORAL | Status: DC
  Administered 2019-01-23 – 2019-01-26 (×6): 17 g via ORAL
  Filled 2019-01-23 (×6): qty 1

## 2019-01-23 MED ORDER — ACETAMINOPHEN 500 MG PO TABS
1000.0000 mg | ORAL_TABLET | Freq: Four times a day (QID) | ORAL | Status: AC
  Administered 2019-01-23 – 2019-01-24 (×4): 1000 mg via ORAL
  Filled 2019-01-23 (×4): qty 2

## 2019-01-23 MED ORDER — HYDROCHLOROTHIAZIDE 12.5 MG PO CAPS
12.5000 mg | ORAL_CAPSULE | Freq: Every day | ORAL | Status: DC
  Administered 2019-01-24 – 2019-01-26 (×3): 12.5 mg via ORAL
  Filled 2019-01-23 (×3): qty 1

## 2019-01-23 MED ORDER — MENTHOL 3 MG MT LOZG: 1.0000 | LOZENGE | OROMUCOSAL | Status: DC | PRN

## 2019-01-23 MED ORDER — ALBUMIN HUMAN 5 % IV SOLN
INTRAVENOUS | Status: DC | PRN
  Administered 2019-01-23: 09:00:00 via INTRAVENOUS

## 2019-01-23 SURGICAL SUPPLY — 66 items
ADAPTER SLEEVE UNV CTAPER PL5 (Orthopedic Implant) ×3 IMPLANT
BAG DECANTER FOR FLEXI CONT (MISCELLANEOUS) ×3 IMPLANT
BAG ZIPLOCK 12X15 (MISCELLANEOUS) ×3 IMPLANT
BLADE SAW SGTL 11.0X1.19X90.0M (BLADE) IMPLANT
BLADE SAW SGTL 18X1.27X75 (BLADE) ×2 IMPLANT
BLADE SAW SGTL 18X1.27X75MM (BLADE) ×1
BRUSH FEMORAL CANAL (MISCELLANEOUS) IMPLANT
COVER SURGICAL LIGHT HANDLE (MISCELLANEOUS) ×3 IMPLANT
COVER WAND RF STERILE (DRAPES) IMPLANT
DERMABOND ADVANCED (GAUZE/BANDAGES/DRESSINGS) ×2
DERMABOND ADVANCED .7 DNX12 (GAUZE/BANDAGES/DRESSINGS) ×1 IMPLANT
DRAPE ORTHO SPLIT 77X108 STRL (DRAPES) ×4
DRAPE POUCH INSTRU U-SHP 10X18 (DRAPES) ×3 IMPLANT
DRAPE SURG 17X11 SM STRL (DRAPES) ×3 IMPLANT
DRAPE SURG ORHT 6 SPLT 77X108 (DRAPES) ×2 IMPLANT
DRAPE U-SHAPE 47X51 STRL (DRAPES) ×3 IMPLANT
DRESSING AQUACEL AG SP 3.5X10 (GAUZE/BANDAGES/DRESSINGS) ×1 IMPLANT
DRSG AQUACEL AG ADV 3.5X10 (GAUZE/BANDAGES/DRESSINGS) IMPLANT
DRSG AQUACEL AG ADV 3.5X14 (GAUZE/BANDAGES/DRESSINGS) ×3 IMPLANT
DRSG AQUACEL AG SP 3.5X10 (GAUZE/BANDAGES/DRESSINGS) ×3
DURAPREP 26ML APPLICATOR (WOUND CARE) ×3 IMPLANT
ELECT BLADE TIP CTD 4 INCH (ELECTRODE) ×3 IMPLANT
ELECT REM PT RETURN 15FT ADLT (MISCELLANEOUS) ×3 IMPLANT
FACESHIELD WRAPAROUND (MASK) ×12 IMPLANT
GAUZE SPONGE 2X2 8PLY STRL LF (GAUZE/BANDAGES/DRESSINGS) ×1 IMPLANT
GLOVE BIOGEL M 7.0 STRL (GLOVE) IMPLANT
GLOVE BIOGEL PI IND STRL 7.5 (GLOVE) ×1 IMPLANT
GLOVE BIOGEL PI IND STRL 8.5 (GLOVE) ×1 IMPLANT
GLOVE BIOGEL PI INDICATOR 7.5 (GLOVE) ×2
GLOVE BIOGEL PI INDICATOR 8.5 (GLOVE) ×2
GLOVE ECLIPSE 8.0 STRL XLNG CF (GLOVE) ×6 IMPLANT
GOWN STRL REUS W/TWL 2XL LVL3 (GOWN DISPOSABLE) ×3 IMPLANT
GOWN STRL REUS W/TWL LRG LVL3 (GOWN DISPOSABLE) ×3 IMPLANT
GRAFT IC CHAMBER LG (Bone Implant) ×3 IMPLANT
HANDPIECE INTERPULSE COAX TIP (DISPOSABLE)
HEAD FEM BIOLOX DELTA UN 36 (Head) ×3 IMPLANT
INSERT TRIDENT POLY 36MM 0DEG (Insert) ×3 IMPLANT
MANIFOLD NEPTUNE II (INSTRUMENTS) ×3 IMPLANT
MARKER SKIN DUAL TIP RULER LAB (MISCELLANEOUS) ×3 IMPLANT
NDL SAFETY ECLIPSE 18X1.5 (NEEDLE) ×1 IMPLANT
NEEDLE HYPO 18GX1.5 SHARP (NEEDLE) ×2
NS IRRIG 1000ML POUR BTL (IV SOLUTION) ×6 IMPLANT
PADDING CAST COTTON 6X4 STRL (CAST SUPPLIES) ×3 IMPLANT
PRESSURIZER FEMORAL UNIV (MISCELLANEOUS) IMPLANT
PROTECTOR NERVE ULNAR (MISCELLANEOUS) ×3 IMPLANT
SCREW HEX LP 6.5X25 (Screw) ×3 IMPLANT
SCREW HEX LP 6.5X40 (Screw) ×3 IMPLANT
SET HNDPC FAN SPRY TIP SCT (DISPOSABLE) IMPLANT
SHELL ACETABUL CLUSTER SZ 54 (Shell) ×3 IMPLANT
SPONGE GAUZE 2X2 STER 10/PKG (GAUZE/BANDAGES/DRESSINGS) ×2
SPONGE LAP 18X18 RF (DISPOSABLE) ×3 IMPLANT
SPONGE LAP 4X18 RFD (DISPOSABLE) ×3 IMPLANT
STAPLER VISISTAT 35W (STAPLE) ×3 IMPLANT
SUCTION FRAZIER 12FR DISP (SUCTIONS) ×3 IMPLANT
SUCTION FRAZIER HANDLE 10FR (MISCELLANEOUS) ×2
SUCTION TUBE FRAZIER 10FR DISP (MISCELLANEOUS) ×1 IMPLANT
SUT STRATAFIX PDS+ 0 24IN (SUTURE) ×3 IMPLANT
SUT VIC AB 1 CT1 36 (SUTURE) ×3 IMPLANT
SUT VIC AB 2-0 CT1 27 (SUTURE) ×6
SUT VIC AB 2-0 CT1 TAPERPNT 27 (SUTURE) ×3 IMPLANT
TOWEL OR 17X26 10 PK STRL BLUE (TOWEL DISPOSABLE) ×6 IMPLANT
TOWER CARTRIDGE SMART MIX (DISPOSABLE) IMPLANT
TRAY FOLEY MTR SLVR 16FR STAT (SET/KITS/TRAYS/PACK) ×3 IMPLANT
TUBE KAMVAC SUCTION (TUBING) IMPLANT
WATER STERILE IRR 1000ML POUR (IV SOLUTION) ×3 IMPLANT
YANKAUER SUCT BULB TIP 10FT TU (MISCELLANEOUS) ×3 IMPLANT

## 2019-01-23 NOTE — Plan of Care (Signed)

## 2019-01-23 NOTE — Anesthesia Procedure Notes (Signed)
Spinal  Patient location during procedure: OR End time: 01/23/2019 7:40 AM Staffing Anesthesiologist: Jairo Ben, MD Performed: anesthesiologist  Preanesthetic Checklist Completed: patient identified, site marked, surgical consent, pre-op evaluation, timeout performed, IV checked, risks and benefits discussed and monitors and equipment checked Spinal Block Patient position: sitting Prep: site prepped and draped and DuraPrep Patient monitoring: blood pressure, continuous pulse ox, cardiac monitor and heart rate Approach: midline Location: L3-4 Injection technique: single-shot Needle Needle type: Pencan  Needle gauge: 24 G Needle length: 9 cm Additional Notes Pt identified in Operating room.  Monitors applied. Working IV access confirmed. Sterile prep, drape lumbar spine.  1% lido local L 3,4.  #24ga Pencan into clear CSF L 3,4.  13.5mg  0.75% Bupivacaine with dextrose injected with asp CSF beginning and end of injection.  Patient asymptomatic, VSS, no heme aspirated, tolerated well.  [name], MD

## 2019-01-23 NOTE — Discharge Instructions (Signed)

## 2019-01-23 NOTE — Interval H&P Note (Signed)
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 After consideration of risks, benefits and other options for treatment, the patient has consented to  Procedure(s) with comments: Revision right total hip arthroplasty likely with bone graft (Right) - 120 mins as a surgical intervention .  The patient's history has been reviewed, patient examined, no change in status, stable for surgery.  I have reviewed the patient's chart and labs.  Questions were answered to the patient's satisfaction.     Shelda Pal

## 2019-01-23 NOTE — Plan of Care (Signed)
  Problem: Education: Goal: Knowledge of General Education information will improve Description: Including pain rating scale, medication(s)/side effects and non-pharmacologic comfort measures Outcome: Progressing   Problem: Health Behavior/Discharge Planning: Goal: Ability to manage health-related needs will improve Outcome: Progressing   Problem: Clinical Measurements: Goal: Diagnostic test results will improve Outcome: Progressing   Problem: Activity: Goal: Risk for activity intolerance will decrease Outcome: Progressing   Problem: Nutrition: Goal: Adequate nutrition will be maintained Outcome: Progressing   Problem: Pain Managment: Goal: General experience of comfort will improve Outcome: Progressing   

## 2019-01-23 NOTE — Anesthesia Procedure Notes (Signed)
Procedure Name: MAC Date/Time: 01/23/2019 7:40 AM Performed by: Eben Burow, CRNA Pre-anesthesia Checklist: Patient identified, Emergency Drugs available, Suction available, Patient being monitored and Timeout performed Oxygen Delivery Method: Simple face mask Dental Injury: Teeth and Oropharynx as per pre-operative assessment

## 2019-01-23 NOTE — Anesthesia Postprocedure Evaluation (Signed)
Anesthesia Post Note  Patient: [name] [name]  Procedure(s) Performed: Revision right total hip arthroplasty with acetabular cup with bone graft (Right Hip)     Patient location during evaluation: PACU Anesthesia Type: Spinal Level of consciousness: awake and alert, patient cooperative and oriented Pain management: pain level controlled Vital Signs Assessment: post-procedure vital signs reviewed and stable Respiratory status: spontaneous breathing, nonlabored ventilation and respiratory function stable Cardiovascular status: blood pressure returned to baseline and stable Postop Assessment: spinal receding, patient able to bend at knees and no apparent nausea or vomiting Anesthetic complications: no    Last Vitals:  Vitals:   01/23/19 1226 01/23/19 1343  BP: 107/68 (!) 108/58  Pulse: 67 70  Resp:  16  Temp: 36.4 C 36.4 C  SpO2: 99% 97%    Last Pain:  Vitals:   01/23/19 1343  TempSrc: Oral  PainSc:                  Kimmora Risenhoover,E. Kyaira Trantham

## 2019-01-23 NOTE — Brief Op Note (Signed)
01/23/2019  9:27 AM  PATIENT:  [name] [name]  58 y.o. 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   Delay start of Pharmacological VTE agent (>24hrs) due to surgical blood loss or risk of bleeding: no

## 2019-01-23 NOTE — Op Note (Signed)
NAME: [name] [name], [name] [name] MEDICAL RECORD [id number] ACCOUNT [id number] DATE OF BIRTH:Jul 27, 1961 FACILITY: [facility] LOCATION: [id number] PHYSICIAN:Eila Runyan D. 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  PROCEDURE:  Revision right total hip arthroplasty.  COMPONENTS USED:  Stryker acetabular size 54 mm Trident II Tritanium acetabular shell, a Trident X3 E liner 36 inner diameter, a 36 Biol[employer] delta ceramic ball with a +5 universal C taper adapter sleeve, 2 cancellous bone screws.  We did use 15 mL of  cancellous bone chips that were packed into the cyst within the ilium.    SURGEON:  Durene Romans, MD  ASSISTANT:  Lanney Gins, PA-C.  Note that [name] was present for the entire case from preoperative positioning, perioperative management of the operative extremity, general facilitation of the case and primary wound closure.  ANESTHESIA:  Spinal.  SPECIMENS:  None.   COMPLICATIONS:  None.  INDICATIONS:  The patient is a 58 year old female with a remote history of right total hip arthroplasty around 25 years ago.  She had been followed in the office as requested on a routine basis, noting some asymmetric wear of the polyethylene and a large  acetabular cyst.  I had discussions with her regarding the potential for catastrophic failure and the need for revision surgery to eliminate the source of the periprosthetic particulate disease process.  She ultimately found the time to be able to get  this taken care of.  The risks of need for future surgery, infection, DVT, dislocation were all discussed and reviewed.  The benefits were discussed and reviewed for this 58 year old female.  Consent was obtained for benefit of long-term  survivorship of  her hip.  Consent was obtained.   PROCEDURE IN DETAIL:  The patient was brought to the operative theater.  Once adequate anesthesia, preoperative antibiotics, and Ancef were administered, she was positioned into the left lateral decubitus position with the right hip up.  The right lower  extremity was then prepped and draped in sterile fashion.  Her old incision was identified.  A timeout was performed identifying the patient, the planned procedure, and extremity.  Her old incision, a portion of it, was excised.  Soft tissue planes were  created down to the iliotibial band and gluteal fascia.  This was then incised.  The posterior aspect of the hip was then exposed in routine fashion, taken down to the level of the posterior scar.  Once I had the posterior 2/3 of the hip exposed, we  dislocated the hip and removed the femoral head.  I elevated the soft tissues off the ilium to allow the trunnion to be placed and replaced retractors inferior and anterior.  Further debridement around the cup was carried out circumferentially for  visualization.  We identified it was a 26 mm head that was previously in place, and utilizing the explant system with a 26 mm ball and a 50 mm starting device, I worked around the cup circumferentially.  I then switched it out for the 50 mm finishing  blade, and I was able to remove the cup with minimal bone loss as identified on the cup.  Once I had the acetabulum exposed, I was able to identify a cyst within the acetabulum, which I debrided.  We then irrigated the wound and began reaming.  I reamed  just to a 53 mm reamer.  I then opened up 15 mL of cancellous bone graft and packed this into the cyst and reverse reamed with a 53 mm reamer and we selected a 54 mm cup.  We used the [employer] II Tritanium acetabular shell and impacted it.  I had  initially fairly good purchase, but I did place 2 screws into the ilium to support this.  We then did a trial  reduction and identified that her anteversion appeared to be adequate of the cup as well as the abduction using the hip guide.  This was then  confirmed and a trial reduction with a trial liner in place and a 36+5 ball.  I found that the combined anteversion appeared to be 50 degrees.  Her leg lengths appeared to be about the same at this point, and there was no evidence of any instability with  extension, external rotation and shucking nor with hip flexion and internal rotation.  She did have some stiffness in her hip despite this.  Given these findings, I elected to proceed in this fashion.  A 36 neutral liner was then opened and impacted  into the acetabular shell, and the final 36 mm delta ceramic ball was opened and the universal 5 mm sleeve was placed inside.  This was then impacted on a clean and dried trunnion.  The hip was reduced.  We irrigated the hip throughout the case and again  at this point.  Based on the initial dissection, there was no significant soft tissue posterior to reapproximate.  Thus, the iliotibial band and gluteal fascia were reapproximated using #1 Vicryl and Stratafix suture.  The remainder of the wound was  closed with 2-0 Vicryl and a running Monocryl stitch.  The hip was then cleaned, dried and dressed sterilely using surgical glue and an Aquacel dressing.  She was then brought to the recovery room in stable condition, tolerating the procedure well.  LN/NUANCE  D:01/23/2019 T:01/23/2019 JOB:[id number]

## 2019-01-23 NOTE — Transfer of Care (Signed)
Immediate Anesthesia Transfer of Care Note  Patient: [name] [name]  Procedure(s) Performed: Revision right total hip arthroplasty with acetabular cup with bone graft (Right Hip)  Patient Location: PACU  Anesthesia Type:Spinal  Level of Consciousness: awake, alert  and oriented  Airway & Oxygen Therapy: Patient Spontanous Breathing and Patient connected to face mask oxygen  Post-op Assessment: Report given to RN and Post -op Vital signs reviewed and stable  Post vital signs: Reviewed and stable  Last Vitals:  Vitals Value Taken Time  BP 113/64 01/23/2019  9:55 AM  Temp 36.4 C 01/23/2019  9:55 AM  Pulse 71 01/23/2019  9:55 AM  Resp 16 01/23/2019  9:55 AM  SpO2      Last Pain:  Vitals:   01/23/19 0547  TempSrc:   PainSc: 0-No pain      Patients Stated Pain Goal: 4 (01/23/19 0547)  Complications: No apparent anesthesia complications
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
## 2019-01-24 ENCOUNTER — Encounter (HOSPITAL_COMMUNITY): Payer: Self-pay | Admitting: Orthopedic Surgery

## 2019-01-24 LAB — BASIC METABOLIC PANEL
Anion gap: 6 (ref 5–15)
BUN: 14 mg/dL (ref 6–20)
CO2: 28 mmol/L (ref 22–32)
Calcium: 8.5 mg/dL — ABNORMAL LOW (ref 8.9–10.3)
Chloride: 104 mmol/L (ref 98–111)
Creatinine, Ser: 0.65 mg/dL (ref 0.44–1.00)
GFR calc Af Amer: >60 mL/min (ref >60)
GFR calc non Af Amer: >60 mL/min (ref >60)
GLUCOSE: 140 mg/dL — AB (ref 70–99)
Potassium: 4.5 mmol/L (ref 3.5–5.1)
Sodium: 138 mmol/L (ref 135–145)

## 2019-01-24 LAB — CBC
HCT: 32.5 % — ABNORMAL LOW (ref 36.0–46.0)
Hemoglobin: 10 g/dL — ABNORMAL LOW (ref 12.0–15.0)
MCH: 29.3 pg (ref 26.0–34.0)
MCHC: 30.8 g/dL (ref 30.0–36.0)
MCV: 95.3 fL (ref 80.0–100.0)
Platelets: 193 10*3/uL (ref 150–400)
RBC: 3.41 MIL/uL — ABNORMAL LOW (ref 3.87–5.11)
RDW: 14.1 % (ref 11.5–15.5)
WBC: 12.7 10*3/uL — ABNORMAL HIGH (ref 4.0–10.5)
nRBC: 0 % (ref 0.0–0.2)

## 2019-01-24 MED ORDER — DOCUSATE SODIUM 100 MG PO CAPS: 100.0000 mg | ORAL_CAPSULE | Freq: Two times a day (BID) | ORAL | 0 refills | Status: DC

## 2019-01-24 MED ORDER — HYDROMORPHONE HCL 2 MG PO TABS: 2.0000 mg | ORAL_TABLET | ORAL | 0 refills | Status: DC | PRN

## 2019-01-24 MED ORDER — ASPIRIN 81 MG PO CHEW: 81.0000 mg | CHEWABLE_TABLET | Freq: Two times a day (BID) | ORAL | 0 refills | Status: DC

## 2019-01-24 MED ORDER — POLYETHYLENE GLYCOL 3350 17 G PO PACK: 17.0000 g | PACK | Freq: Two times a day (BID) | ORAL | 0 refills | Status: DC

## 2019-01-24 MED ORDER — ACETAMINOPHEN 500 MG PO TABS: 1000.0000 mg | ORAL_TABLET | Freq: Three times a day (TID) | ORAL | 0 refills | Status: DC

## 2019-01-24 MED ORDER — FERROUS SULFATE 325 (65 FE) MG PO TABS: 325.0000 mg | ORAL_TABLET | Freq: Three times a day (TID) | ORAL | 3 refills | Status: DC

## 2019-01-24 MED ORDER — METHOCARBAMOL 500 MG PO TABS: 500.0000 mg | ORAL_TABLET | Freq: Four times a day (QID) | ORAL | 0 refills | Status: DC | PRN
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
## 2019-01-24 NOTE — Progress Notes (Addendum)
   Subjective: 1 Day Post-Op Procedure(s) (LRB): Revision right total hip arthroplasty with acetabular cup with bone graft (Right) Patient reports pain as mild.   Patient seen in rounds with Dr. Charlann Boxer. Patient is well, and has had no acute complaints or problems other than pain in the right hip. No acute events overnight. Positive flatus. No CP, SHOB, calf pain.  We will start therapy today.   Objective: Vital signs in last 24 hours: Temp:  [97.5 F (36.4 C)-97.9 F (36.6 C)] 97.6 F (36.4 C) (02/14 0606) Pulse Rate:  [51-71] 52 (02/14 0606) Resp:  [12-18] 17 (02/14 0606) BP: (95-125)/(58-68) 105/65 (02/14 0606) SpO2:  [83 %-100 %] 100 % (02/14 0606)  Intake/Output from previous day:  Intake/Output Summary (Last 24 hours) at 01/24/2019 0740 Last data filed at 01/24/2019 0600 Gross per 24 hour  Intake 4809.03 ml  Output 3050 ml  Net 1759.03 ml     Intake/Output this shift: No intake/output data recorded.  Labs: Recent Labs    01/24/19 0455  HGB 10.0*   Recent Labs    01/24/19 0455  WBC 12.7*  RBC 3.41*  HCT 32.5*  PLT 193   Recent Labs    01/24/19 0455  NA 138  K 4.5  CL 104  CO2 28  BUN 14  CREATININE 0.65  GLUCOSE 140*  CALCIUM 8.5*   No results for input(s): LABPT, INR in the last 72 hours.  Exam: General - Patient is Alert, Appropriate and Oriented Extremity - Neurologically intact Sensation intact distally Intact pulses distally Dorsiflexion/Plantar flexion intact Dressing - dressing C/D/I Motor Function - intact, moving foot and toes well on exam.   Past Medical History:  Diagnosis Date  . Anemia    hx of years ago  . ANXIETY    no meds  . ARTHRITIS   . ASTHMA   . Complication of anesthesia   . DEGENERATIVE JOINT DISEASE   . EMPHYSEMA, MILD    pt. denies at preop  . Family history of adverse reaction to anesthesia    MOM PONV  . GALLBLADDER DISEASE, HX OF   . HYPERLIPIDEMIA   . HYPERTENSION   . Multinodular goiter 05/2001 dx   s/p bx: benign  . Palpitations   . PONV (postoperative nausea and vomiting)   . TOBACCO ABUSE    hx of; quit 1/22016  . Vitamin D deficiency     Assessment/Plan: 1 Day Post-Op Procedure(s) (LRB): Revision right total hip arthroplasty with acetabular cup with bone graft (Right) Principal Problem:   S/P right TH revision  Estimated body mass index is 34.64 kg/m as calculated from the following:   Height as of this encounter: 5\' 4"  (1.626 m).   Weight as of this encounter: 91.5 kg. Advance diet Up with therapy  DVT Prophylaxis - Aspirin PWB at 50% to the RLE Hip precautions discussed with patient  Plan is to go Home after hospital stay. Patient will be home alone, and will continue working with therapy to ensure safe discharge. Plan for discharge tomorrow pending progress with therapy. PWB 50% to the RLE for 6 weeks. We will revisit weight bearing status at 2 week post up visit. Follow up in the office in 2 weeks with Dr. Charlann Boxer.  Dennie Bible, PA-C Orthopedic Surgery 01/24/2019, 7:40 AM
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
## 2019-01-25 LAB — BASIC METABOLIC PANEL
Anion gap: 6 (ref 5–15)
BUN: 24 mg/dL — ABNORMAL HIGH (ref 6–20)
CO2: 31 mmol/L (ref 22–32)
Calcium: 8.6 mg/dL — ABNORMAL LOW (ref 8.9–10.3)
Chloride: 102 mmol/L (ref 98–111)
Creatinine, Ser: 0.7 mg/dL (ref 0.44–1.00)
GFR calc Af Amer: >60 mL/min (ref >60)
GFR calc non Af Amer: >60 mL/min (ref >60)
Glucose, Bld: 126 mg/dL — ABNORMAL HIGH (ref 70–99)
Potassium: 4.2 mmol/L (ref 3.5–5.1)
SODIUM: 139 mmol/L (ref 135–145)

## 2019-01-25 LAB — CBC
HCT: 31 % — ABNORMAL LOW (ref 36.0–46.0)
Hemoglobin: 9.6 g/dL — ABNORMAL LOW (ref 12.0–15.0)
MCH: 30.3 pg (ref 26.0–34.0)
MCHC: 31 g/dL (ref 30.0–36.0)
MCV: 97.8 fL (ref 80.0–100.0)
Platelets: 185 10*3/uL (ref 150–400)
RBC: 3.17 MIL/uL — ABNORMAL LOW (ref 3.87–5.11)
RDW: 14.6 % (ref 11.5–15.5)
WBC: 14.8 10*3/uL — ABNORMAL HIGH (ref 4.0–10.5)
nRBC: 0 % (ref 0.0–0.2)

## 2019-01-25 NOTE — Progress Notes (Addendum)
Subjective: 2 Days Post-Op Procedure(s) (LRB): Revision right total hip arthroplasty with acetabular cup with bone graft (Right) Patient reports pain as 4 on 0-10 scale.   No acute events over night. Tolerating PO, no nausea. +Flatus, +voiding, -BM, -Sweats/chills, -SOB, CP, calf pain. Objective: Vital signs in last 24 hours: Temp:  [97.5 F (36.4 C)-98.4 F (36.9 C)] 98.4 F (36.9 C) (02/15 0507) Pulse Rate:  [51-60] 58 (02/15 0507) Resp:  [16-17] 17 (02/14 2241) BP: (104-118)/(60-85) 118/69 (02/15 0507) SpO2:  [93 %-99 %] 98 % (02/15 0507)  Intake/Output from previous day: 02/14 0701 - 02/15 0700 In: 687.2 [P.O.:460; I.V.:227.2] Out: 0  Intake/Output this shift: No intake/output data recorded.  Recent Labs    01/24/19 0455 01/25/19 0509  HGB 10.0* 9.6*   Recent Labs    01/24/19 0455 01/25/19 0509  WBC 12.7* 14.8*  RBC 3.41* 3.17*  HCT 32.5* 31.0*  PLT 193 185   Recent Labs    01/24/19 0455 01/25/19 0509  NA 138 139  K 4.5 4.2  CL 104 102  CO2 28 31  BUN 14 24*  CREATININE 0.65 0.70  GLUCOSE 140* 126*  CALCIUM 8.5* 8.6*   No results for input(s): LABPT, INR in the last 72 hours.  Neurologically intact ABD soft Neurovascular intact Sensation intact distally Intact pulses distally Dorsiflexion/Plantar flexion intact Incision: dressing C/D/I No cellulitis present Compartment soft, homans - bilaterally   Assessment/Plan: 2 Days Post-Op Procedure(s) (LRB): Revision right total hip arthroplasty with acetabular cup with bone graft (Right) DVT Prophylaxis: ASA, SCDs, TEDs, ambulation OOB with PT, PWB ar 50%, hip precautions Encourage IS Plan discharge today pending PT   Addendum: After talking with PT this am, recommendation is not to D/C patient today. Also recommending HPT.     Leonette Monarch Conlee Sliter 01/25/2019, 8:53 AM
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
## 2019-01-25 NOTE — Plan of Care (Signed)
  Problem: Pain Managment: Goal: General experience of comfort will improve Outcome: Progressing   Problem: Activity: Goal: Risk for activity intolerance will decrease Outcome: Progressing   
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
## 2019-01-25 NOTE — Care Management Note (Signed)
Case Management Note  Patient Details  Name: [name] [name] MRN: [id number] Date of Birth: January 20, 1961  Subjective/Objective:   Right THA                 Action/Plan: NCM spoke to pt and offered choice for [facility]. Provided Medicare list and placed on chart. Pt agreeable to [facility] for HH. Has RN and 3n1 bedside commode at home. Family will be at home to assist with care.   Expected Discharge Date:  01/25/19               Expected Discharge Plan:  Home w Home Health Services  In-House Referral:  NA  Discharge planning Services  CM Consult  Post Acute Care Choice:  Home Health Choice offered to:  Patient  DME Arranged:  N/A DME Agency:  NA  HH Arranged:  PT HH Agency:  [facility]  Status of Service:  Completed, signed off  If discussed at [employer], dates discussed:    Additional Comments:  Elliot Cousin, RN 01/25/2019, 11:00 AM

## 2019-01-26 NOTE — Progress Notes (Signed)
Patient discharged to home with family. Given all belongings, instructions. Verbalized understanding of instructions. Escorted to pov via w/c. 
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
## 2019-01-26 NOTE — Progress Notes (Signed)
     Subjective: 3 Days Post-Op Procedure(s) (LRB): Revision right total hip arthroplasty with acetabular cup with bone graft (Right)   Patient reports pain as mild, pain controlled.  No events throughout the night. Feels a little weary with initiating motion, but better after she is moving. No reported events throughout the night. Ready to be discharged home after PT.     Objective:   VITALS:   Vitals:   01/25/19 2217 01/26/19 0546  BP: 126/67 127/69  Pulse: (!) 56 67  Resp: 14 16  Temp: 98.3 F (36.8 C) 98.2 F (36.8 C)  SpO2: 96% 96%    Dorsiflexion/Plantar flexion intact Incision: dressing C/D/I No cellulitis present Compartment soft  LABS Recent Labs    01/24/19 0455 01/25/19 0509  HGB 10.0* 9.6*  HCT 32.5* 31.0*  WBC 12.7* 14.8*  PLT 193 185    Recent Labs    01/24/19 0455 01/25/19 0509  NA 138 139  K 4.5 4.2  BUN 14 24*  CREATININE 0.65 0.70  GLUCOSE 140* 126*     Assessment/Plan: 3 Days Post-Op Procedure(s) (LRB): Revision right total hip arthroplasty with acetabular cup with bone graft (Right) Up with therapy Discharge home Follow up in 2 weeks at [facility]). Follow up with OLIN,Alera Quevedo D in 2 weeks.  Contact information:  [facility]) [address] [place] [address] [phone]        Anastasio Auerbach. Charissa Knowles   PAC  01/26/2019, 8:03 AM
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
## 2019-01-28 DIAGNOSIS — M5136 Other intervertebral disc degeneration, lumbar region: Secondary | ICD-10-CM | POA: Diagnosis not present

## 2019-01-28 DIAGNOSIS — Z9181 History of falling: Secondary | ICD-10-CM | POA: Diagnosis not present

## 2019-01-28 DIAGNOSIS — M1991 Primary osteoarthritis, unspecified site: Secondary | ICD-10-CM | POA: Diagnosis not present

## 2019-01-28 DIAGNOSIS — J439 Emphysema, unspecified: Secondary | ICD-10-CM | POA: Diagnosis not present

## 2019-01-28 DIAGNOSIS — Z7982 Long term (current) use of aspirin: Secondary | ICD-10-CM | POA: Diagnosis not present

## 2019-01-28 DIAGNOSIS — I10 Essential (primary) hypertension: Secondary | ICD-10-CM | POA: Diagnosis not present

## 2019-01-28 DIAGNOSIS — E042 Nontoxic multinodular goiter: Secondary | ICD-10-CM | POA: Diagnosis not present

## 2019-01-28 DIAGNOSIS — Z87891 Personal history of nicotine dependence: Secondary | ICD-10-CM | POA: Diagnosis not present

## 2019-01-28 DIAGNOSIS — F419 Anxiety disorder, unspecified: Secondary | ICD-10-CM | POA: Diagnosis not present

## 2019-01-28 DIAGNOSIS — T84050D Periprosthetic osteolysis of internal prosthetic right hip joint, subsequent encounter: Secondary | ICD-10-CM | POA: Diagnosis not present

## 2019-01-28 DIAGNOSIS — Z6834 Body mass index (BMI) 34.0-34.9, adult: Secondary | ICD-10-CM | POA: Diagnosis not present

## 2019-01-28 DIAGNOSIS — E785 Hyperlipidemia, unspecified: Secondary | ICD-10-CM | POA: Diagnosis not present

## 2019-01-28 DIAGNOSIS — M533 Sacrococcygeal disorders, not elsewhere classified: Secondary | ICD-10-CM | POA: Diagnosis not present

## 2019-01-28 DIAGNOSIS — J45909 Unspecified asthma, uncomplicated: Secondary | ICD-10-CM | POA: Diagnosis not present

## 2019-01-28 DIAGNOSIS — E669 Obesity, unspecified: Secondary | ICD-10-CM | POA: Diagnosis not present

## 2019-01-28 DIAGNOSIS — E559 Vitamin D deficiency, unspecified: Secondary | ICD-10-CM | POA: Diagnosis not present

## 2019-01-28 NOTE — Discharge Summary (Signed)
Physician Discharge Summary  Patient ID: [name] [name] MRN: [id number] DOB/AGE: Jun 13, 1961 58 y.o.  Admit date: 01/23/2019 Discharge date: 01/26/2019   Procedures:  Procedure(s) (LRB): Revision right total hip arthroplasty with acetabular cup with bone graft (Right)  Attending Physician:  Dr. Durene Romans   Admission Diagnoses:    Right hip pain s/p THA  Discharge Diagnoses:  Principal Problem:   S/P right TH revision  Past Medical History:  Diagnosis Date  . Anemia    hx of years ago  . ANXIETY    no meds  . ARTHRITIS   . ASTHMA   . Complication of anesthesia   . DEGENERATIVE JOINT DISEASE   . EMPHYSEMA, MILD    pt. denies at preop  . Family history of adverse reaction to anesthesia    MOM PONV  . GALLBLADDER DISEASE, HX OF   . HYPERLIPIDEMIA   . HYPERTENSION   . Multinodular goiter 05/2001 dx   s/p bx: benign  . Palpitations   . PONV (postoperative nausea and vomiting)   . TOBACCO ABUSE    hx of; quit 1/22016  . Vitamin D deficiency     HPI:    [name] [name], 58 y.o. female, has a history of pain and functional disability in the right hip due to failed previous hip arthroplasty and patient has failed non-surgical conservative treatments for greater than 12 weeks to include NSAID's and/or analgesics and activity modification. The indications for the revision total hip arthroplasty are progressive or substantial periprosthetic bone loss and bearing surface wear leading to  symptomatic synovitis. Onset of symptoms was gradual starting 1+ years ago with gradually worsening course since that time.  Prior procedures on the right hip include arthroplasty. Patient currently rates pain in the right hip at 5 out of 10 with activity.  There is night pain, worsening of pain with activity and weight bearing, trendelenberg gait, pain that interfers with activities of daily living and pain with passive range of motion. Patient has evidence of prosthetic loosening by imaging studies.   This condition presents safety issues increasing the risk of falls.  There is no current active infection.   Risks, benefits and expectations were discussed with the patient.  Risks including but not limited to the risk of anesthesia, blood clots, nerve damage, blood vessel damage, failure of the prosthesis, infection and up to and including death.  Patient understand the risks, benefits and expectations and wishes to proceed with surgery.  PCP: Anne Ng, NP   Discharged Condition: good  Hospital Course:  Patient underwent the above stated procedure on 01/23/2019. Patient tolerated the procedure well and brought to the recovery room in good condition and subsequently to the floor.  POD #1 BP: 105/65 ; Pulse: 52 ; Temp: 97.6 F (36.4 C) ; Resp: 17 Patient reports pain as mild.  Patient is well, and has had no acute complaints or problems other than pain in the right hip. No acute events overnight. Positive flatus. No CP, SHOB, calf pain.  We will start therapy today.  Patient is Alert, Appropriate and Oriented.  Neurologically intact, sensation intact distally, intact pulses distally and dorsiflexion/plantar flexion intact.  Dressing C/D/I.  Motor Function intact, moving foot and toes well on exam.   LABS  Basename    HGB     10.0  HCT     32.5   POD #2  BP: 118/69 ; Pulse: 58 ; Temp: 98.4 F (36.9 C) ; Resp: 17 Patient reports  pain as 4 on 0-10 scale.   No acute events over night. Tolerating PO, no nausea. +Flatus, +voiding, -BM, -Sweats/chills, -SOB, CP, calf pain. Dorsiflexion/plantar flexion intact, incision: dressing C/D/I, no cellulitis present and compartment soft.   LABS  Basename    HGB     6.9  HCT     31.0   POD #3  BP: 127/69 ; Pulse: 67 ; Temp: 98.2 F (36.8 C) ; Resp: 16 Patient reports pain as mild, pain controlled.  No events throughout the night. Feels a little weary with initiating motion, but better after she is moving. No reported events throughout the  night. Ready to be discharged home after PT. Dorsiflexion/plantar flexion intact, incision: dressing C/D/I, no cellulitis present and compartment soft.   LABS   No new labs   Discharge Exam: General appearance: alert, cooperative and no distress Extremities: Homans sign is negative, no sign of DVT, no edema, redness or tenderness in the calves or thighs and no ulcers, gangrene or trophic changes  Disposition:  Home with follow up in 2 weeks   Follow-up Information    Durene Romanslin, Cherylann Hobday, MD. Schedule an appointment as soon as possible for a visit in 2 weeks.   Specialty:  Orthopedic Surgery Contact information: [address] [place] [address] [phone]        [facility] Follow up.   Specialty:  Home Health Services Why:  Home Health Physical Therapy-agency will call to arrange appointment Contact information: [address] [place] [address] [phone]           Discharge Instructions    Call MD / Call 911   Complete by:  As directed    If you experience chest pain or shortness of breath, CALL 911 and be transported to the hospital emergency room.  If you develope a fever above 101 F, pus (white drainage) or increased drainage or redness at the wound, or calf pain, call your surgeon's office.   Change dressing   Complete by:  As directed    Maintain surgical dressing until follow up in the clinic. If the edges start to pull up, may reinforce with tape. If the dressing is no longer working, may remove and cover with gauze and tape, but must keep the area dry and clean.  Call with any questions or concerns.   Constipation Prevention   Complete by:  As directed    Drink plenty of fluids.  Prune juice may be helpful.  You may use a stool softener, such as Colace (over the counter) 100 mg twice a day.  Use MiraLax (over the counter) for constipation as needed.   Diet - low sodium heart healthy   Complete by:  As directed    Discharge  instructions   Complete by:  As directed    Maintain surgical dressing until follow up in the clinic. If the edges start to pull up, may reinforce with tape. If the dressing is no longer working, may remove and cover with gauze and tape, but must keep the area dry and clean.  [facility]. Call with any questions or concerns.   Partial weight bearing   Complete by:  As directed    % Body Weight:  50   Laterality:  right   Extremity:  Lower   TED hose   Complete by:  As directed    Use stockings (TED hose) for 2 weeks on both  leg(s).  You may remove them at night for sleeping.      Allergies as of 01/26/2019      Reactions   Codeine Nausea And Vomiting, Other (See Comments)   Makes pt sick      Medication List    STOP taking these medications   aspirin 81 MG tablet Replaced by:  aspirin 81 MG chewable tablet     TAKE these medications   acetaminophen 500 MG tablet Commonly known as:  TYLENOL Take 2 tablets (1,000 mg total) by mouth every 8 (eight) hours.   AIRBORNE PO Take 2 tablets by mouth daily.   aspirin 81 MG chewable tablet Commonly known as:  ASPIRIN CHILDRENS Chew 1 tablet (81 mg total) by mouth 2 (two) times daily for 30 days. Take for 4 weeks, then resume regular dose. Replaces:  aspirin 81 MG tablet   CALCIUM PO Take by mouth.   docusate sodium 100 MG capsule Commonly known as:  COLACE Take 1 capsule (100 mg total) by mouth 2 (two) times daily.   ferrous sulfate 325 (65 FE) MG tablet Commonly known as:  FERROUSUL Take 1 tablet (325 mg total) by mouth 3 (three) times daily with meals.   hydrochlorothiazide 12.5 MG tablet Commonly known as:  HYDRODIURIL Take 1 tablet (12.5 mg total) by mouth daily. Please keep upcoming appt for future refills. Thank you.   HYDROmorphone 2 MG tablet Commonly known as:  DILAUDID Take 1-2 tablets (2-4 mg total) by mouth every 4 (four) hours as needed for severe pain.   losartan 25 MG  tablet Commonly known as:  COZAAR Take 1 tablet (25 mg total) by mouth daily.   methocarbamol 500 MG tablet Commonly known as:  ROBAXIN Take 1 tablet (500 mg total) by mouth every 6 (six) hours as needed for muscle spasms.   multivitamin tablet Take 1 tablet by mouth daily.   neomycin-bacitracin-polymyxin ointment Commonly known as:  NEOSPORIN Apply 1 application topically daily as needed for wound care.   polyethylene glycol packet Commonly known as:  MIRALAX / GLYCOLAX Take 17 g by mouth 2 (two) times daily.   rosuvastatin 20 MG tablet Commonly known as:  CRESTOR TAKE 1 TABLET BY MOUTH EVERY DAY   SM CALCIUM 500/VITAMIN D3 PO Take 2 tablets by mouth daily.   VISINE 0.05 % ophthalmic solution Generic drug:  tetrahydrozoline Place 1 drop into both eyes daily as needed (for dry eyes).   Vitamin C 250 MG Chew Chew 250 mg by mouth daily.            Discharge Care Instructions  (From admission, onward)         Start     Ordered   01/24/19 0000  Change dressing    Comments:  Maintain surgical dressing until follow up in the clinic. If the edges start to pull up, may reinforce with tape. If the dressing is no longer working, may remove and cover with gauze and tape, but must keep the area dry and clean.  Call with any questions or concerns.   01/24/19 0841   01/24/19 0000  Partial weight bearing    Question Answer Comment  % Body Weight 50   Laterality right   Extremity Lower      01/24/19 0841           Signed: Anastasio Auerbach. Shun Pletz   PA-C  01/28/2019, 1:41 PM

## 2019-01-30 DIAGNOSIS — E042 Nontoxic multinodular goiter: Secondary | ICD-10-CM | POA: Diagnosis not present

## 2019-01-30 DIAGNOSIS — Z6834 Body mass index (BMI) 34.0-34.9, adult: Secondary | ICD-10-CM | POA: Diagnosis not present

## 2019-01-30 DIAGNOSIS — Z7982 Long term (current) use of aspirin: Secondary | ICD-10-CM | POA: Diagnosis not present

## 2019-01-30 DIAGNOSIS — M533 Sacrococcygeal disorders, not elsewhere classified: Secondary | ICD-10-CM | POA: Diagnosis not present

## 2019-01-30 DIAGNOSIS — F419 Anxiety disorder, unspecified: Secondary | ICD-10-CM | POA: Diagnosis not present

## 2019-01-30 DIAGNOSIS — M1991 Primary osteoarthritis, unspecified site: Secondary | ICD-10-CM | POA: Diagnosis not present

## 2019-01-30 DIAGNOSIS — M5136 Other intervertebral disc degeneration, lumbar region: Secondary | ICD-10-CM | POA: Diagnosis not present

## 2019-01-30 DIAGNOSIS — E785 Hyperlipidemia, unspecified: Secondary | ICD-10-CM | POA: Diagnosis not present

## 2019-01-30 DIAGNOSIS — E669 Obesity, unspecified: Secondary | ICD-10-CM | POA: Diagnosis not present

## 2019-01-30 DIAGNOSIS — J439 Emphysema, unspecified: Secondary | ICD-10-CM | POA: Diagnosis not present

## 2019-01-30 DIAGNOSIS — T84050D Periprosthetic osteolysis of internal prosthetic right hip joint, subsequent encounter: Secondary | ICD-10-CM | POA: Diagnosis not present

## 2019-01-30 DIAGNOSIS — Z9181 History of falling: Secondary | ICD-10-CM | POA: Diagnosis not present

## 2019-01-30 DIAGNOSIS — Z87891 Personal history of nicotine dependence: Secondary | ICD-10-CM | POA: Diagnosis not present

## 2019-01-30 DIAGNOSIS — I10 Essential (primary) hypertension: Secondary | ICD-10-CM | POA: Diagnosis not present

## 2019-01-30 DIAGNOSIS — J45909 Unspecified asthma, uncomplicated: Secondary | ICD-10-CM | POA: Diagnosis not present

## 2019-01-30 DIAGNOSIS — E559 Vitamin D deficiency, unspecified: Secondary | ICD-10-CM | POA: Diagnosis not present

## 2019-01-31 DIAGNOSIS — E785 Hyperlipidemia, unspecified: Secondary | ICD-10-CM | POA: Diagnosis not present

## 2019-01-31 DIAGNOSIS — F419 Anxiety disorder, unspecified: Secondary | ICD-10-CM | POA: Diagnosis not present

## 2019-01-31 DIAGNOSIS — M533 Sacrococcygeal disorders, not elsewhere classified: Secondary | ICD-10-CM | POA: Diagnosis not present

## 2019-01-31 DIAGNOSIS — Z6834 Body mass index (BMI) 34.0-34.9, adult: Secondary | ICD-10-CM | POA: Diagnosis not present

## 2019-01-31 DIAGNOSIS — E559 Vitamin D deficiency, unspecified: Secondary | ICD-10-CM | POA: Diagnosis not present

## 2019-01-31 DIAGNOSIS — T84050D Periprosthetic osteolysis of internal prosthetic right hip joint, subsequent encounter: Secondary | ICD-10-CM | POA: Diagnosis not present

## 2019-01-31 DIAGNOSIS — M1991 Primary osteoarthritis, unspecified site: Secondary | ICD-10-CM | POA: Diagnosis not present

## 2019-01-31 DIAGNOSIS — M5136 Other intervertebral disc degeneration, lumbar region: Secondary | ICD-10-CM | POA: Diagnosis not present

## 2019-01-31 DIAGNOSIS — Z87891 Personal history of nicotine dependence: Secondary | ICD-10-CM | POA: Diagnosis not present

## 2019-01-31 DIAGNOSIS — J45909 Unspecified asthma, uncomplicated: Secondary | ICD-10-CM | POA: Diagnosis not present

## 2019-01-31 DIAGNOSIS — I10 Essential (primary) hypertension: Secondary | ICD-10-CM | POA: Diagnosis not present

## 2019-01-31 DIAGNOSIS — J439 Emphysema, unspecified: Secondary | ICD-10-CM | POA: Diagnosis not present

## 2019-01-31 DIAGNOSIS — E042 Nontoxic multinodular goiter: Secondary | ICD-10-CM | POA: Diagnosis not present

## 2019-01-31 DIAGNOSIS — Z7982 Long term (current) use of aspirin: Secondary | ICD-10-CM | POA: Diagnosis not present

## 2019-01-31 DIAGNOSIS — Z9181 History of falling: Secondary | ICD-10-CM | POA: Diagnosis not present

## 2019-01-31 DIAGNOSIS — E669 Obesity, unspecified: Secondary | ICD-10-CM | POA: Diagnosis not present

## 2019-02-03 DIAGNOSIS — E042 Nontoxic multinodular goiter: Secondary | ICD-10-CM | POA: Diagnosis not present

## 2019-02-03 DIAGNOSIS — J45909 Unspecified asthma, uncomplicated: Secondary | ICD-10-CM | POA: Diagnosis not present

## 2019-02-03 DIAGNOSIS — I10 Essential (primary) hypertension: Secondary | ICD-10-CM | POA: Diagnosis not present

## 2019-02-03 DIAGNOSIS — M5136 Other intervertebral disc degeneration, lumbar region: Secondary | ICD-10-CM | POA: Diagnosis not present

## 2019-02-03 DIAGNOSIS — Z87891 Personal history of nicotine dependence: Secondary | ICD-10-CM | POA: Diagnosis not present

## 2019-02-03 DIAGNOSIS — E785 Hyperlipidemia, unspecified: Secondary | ICD-10-CM | POA: Diagnosis not present

## 2019-02-03 DIAGNOSIS — M533 Sacrococcygeal disorders, not elsewhere classified: Secondary | ICD-10-CM | POA: Diagnosis not present

## 2019-02-03 DIAGNOSIS — J439 Emphysema, unspecified: Secondary | ICD-10-CM | POA: Diagnosis not present

## 2019-02-03 DIAGNOSIS — F419 Anxiety disorder, unspecified: Secondary | ICD-10-CM | POA: Diagnosis not present

## 2019-02-03 DIAGNOSIS — Z9181 History of falling: Secondary | ICD-10-CM | POA: Diagnosis not present

## 2019-02-03 DIAGNOSIS — Z7982 Long term (current) use of aspirin: Secondary | ICD-10-CM | POA: Diagnosis not present

## 2019-02-03 DIAGNOSIS — E669 Obesity, unspecified: Secondary | ICD-10-CM | POA: Diagnosis not present

## 2019-02-03 DIAGNOSIS — E559 Vitamin D deficiency, unspecified: Secondary | ICD-10-CM | POA: Diagnosis not present

## 2019-02-03 DIAGNOSIS — M1991 Primary osteoarthritis, unspecified site: Secondary | ICD-10-CM | POA: Diagnosis not present

## 2019-02-03 DIAGNOSIS — T84050D Periprosthetic osteolysis of internal prosthetic right hip joint, subsequent encounter: Secondary | ICD-10-CM | POA: Diagnosis not present

## 2019-02-03 DIAGNOSIS — Z6834 Body mass index (BMI) 34.0-34.9, adult: Secondary | ICD-10-CM | POA: Diagnosis not present

## 2019-02-04 DIAGNOSIS — Z87891 Personal history of nicotine dependence: Secondary | ICD-10-CM | POA: Diagnosis not present

## 2019-02-04 DIAGNOSIS — M5136 Other intervertebral disc degeneration, lumbar region: Secondary | ICD-10-CM | POA: Diagnosis not present

## 2019-02-04 DIAGNOSIS — F419 Anxiety disorder, unspecified: Secondary | ICD-10-CM | POA: Diagnosis not present

## 2019-02-04 DIAGNOSIS — Z6834 Body mass index (BMI) 34.0-34.9, adult: Secondary | ICD-10-CM | POA: Diagnosis not present

## 2019-02-04 DIAGNOSIS — J45909 Unspecified asthma, uncomplicated: Secondary | ICD-10-CM | POA: Diagnosis not present

## 2019-02-04 DIAGNOSIS — E559 Vitamin D deficiency, unspecified: Secondary | ICD-10-CM | POA: Diagnosis not present

## 2019-02-04 DIAGNOSIS — J439 Emphysema, unspecified: Secondary | ICD-10-CM | POA: Diagnosis not present

## 2019-02-04 DIAGNOSIS — E785 Hyperlipidemia, unspecified: Secondary | ICD-10-CM | POA: Diagnosis not present

## 2019-02-04 DIAGNOSIS — Z9181 History of falling: Secondary | ICD-10-CM | POA: Diagnosis not present

## 2019-02-04 DIAGNOSIS — E669 Obesity, unspecified: Secondary | ICD-10-CM | POA: Diagnosis not present

## 2019-02-04 DIAGNOSIS — M533 Sacrococcygeal disorders, not elsewhere classified: Secondary | ICD-10-CM | POA: Diagnosis not present

## 2019-02-04 DIAGNOSIS — Z7982 Long term (current) use of aspirin: Secondary | ICD-10-CM | POA: Diagnosis not present

## 2019-02-04 DIAGNOSIS — M1991 Primary osteoarthritis, unspecified site: Secondary | ICD-10-CM | POA: Diagnosis not present

## 2019-02-04 DIAGNOSIS — E042 Nontoxic multinodular goiter: Secondary | ICD-10-CM | POA: Diagnosis not present

## 2019-02-04 DIAGNOSIS — I10 Essential (primary) hypertension: Secondary | ICD-10-CM | POA: Diagnosis not present

## 2019-02-04 DIAGNOSIS — T84050D Periprosthetic osteolysis of internal prosthetic right hip joint, subsequent encounter: Secondary | ICD-10-CM | POA: Diagnosis not present

## 2019-02-05 DIAGNOSIS — J439 Emphysema, unspecified: Secondary | ICD-10-CM | POA: Diagnosis not present

## 2019-02-05 DIAGNOSIS — M533 Sacrococcygeal disorders, not elsewhere classified: Secondary | ICD-10-CM | POA: Diagnosis not present

## 2019-02-05 DIAGNOSIS — Z6834 Body mass index (BMI) 34.0-34.9, adult: Secondary | ICD-10-CM | POA: Diagnosis not present

## 2019-02-05 DIAGNOSIS — Z7982 Long term (current) use of aspirin: Secondary | ICD-10-CM | POA: Diagnosis not present

## 2019-02-05 DIAGNOSIS — F419 Anxiety disorder, unspecified: Secondary | ICD-10-CM | POA: Diagnosis not present

## 2019-02-05 DIAGNOSIS — Z87891 Personal history of nicotine dependence: Secondary | ICD-10-CM | POA: Diagnosis not present

## 2019-02-05 DIAGNOSIS — J45909 Unspecified asthma, uncomplicated: Secondary | ICD-10-CM | POA: Diagnosis not present

## 2019-02-05 DIAGNOSIS — I10 Essential (primary) hypertension: Secondary | ICD-10-CM | POA: Diagnosis not present

## 2019-02-05 DIAGNOSIS — M1991 Primary osteoarthritis, unspecified site: Secondary | ICD-10-CM | POA: Diagnosis not present

## 2019-02-05 DIAGNOSIS — T84050D Periprosthetic osteolysis of internal prosthetic right hip joint, subsequent encounter: Secondary | ICD-10-CM | POA: Diagnosis not present

## 2019-02-05 DIAGNOSIS — M5136 Other intervertebral disc degeneration, lumbar region: Secondary | ICD-10-CM | POA: Diagnosis not present

## 2019-02-05 DIAGNOSIS — E669 Obesity, unspecified: Secondary | ICD-10-CM | POA: Diagnosis not present

## 2019-02-05 DIAGNOSIS — E785 Hyperlipidemia, unspecified: Secondary | ICD-10-CM | POA: Diagnosis not present

## 2019-02-05 DIAGNOSIS — Z9181 History of falling: Secondary | ICD-10-CM | POA: Diagnosis not present

## 2019-02-05 DIAGNOSIS — E042 Nontoxic multinodular goiter: Secondary | ICD-10-CM | POA: Diagnosis not present

## 2019-02-05 DIAGNOSIS — E559 Vitamin D deficiency, unspecified: Secondary | ICD-10-CM | POA: Diagnosis not present

## 2019-02-07 DIAGNOSIS — Z87891 Personal history of nicotine dependence: Secondary | ICD-10-CM | POA: Diagnosis not present

## 2019-02-07 DIAGNOSIS — M5136 Other intervertebral disc degeneration, lumbar region: Secondary | ICD-10-CM | POA: Diagnosis not present

## 2019-02-07 DIAGNOSIS — J439 Emphysema, unspecified: Secondary | ICD-10-CM | POA: Diagnosis not present

## 2019-02-07 DIAGNOSIS — I10 Essential (primary) hypertension: Secondary | ICD-10-CM | POA: Diagnosis not present

## 2019-02-07 DIAGNOSIS — F419 Anxiety disorder, unspecified: Secondary | ICD-10-CM | POA: Diagnosis not present

## 2019-02-07 DIAGNOSIS — E785 Hyperlipidemia, unspecified: Secondary | ICD-10-CM | POA: Diagnosis not present

## 2019-02-07 DIAGNOSIS — M1991 Primary osteoarthritis, unspecified site: Secondary | ICD-10-CM | POA: Diagnosis not present

## 2019-02-07 DIAGNOSIS — J45909 Unspecified asthma, uncomplicated: Secondary | ICD-10-CM | POA: Diagnosis not present

## 2019-02-07 DIAGNOSIS — Z7982 Long term (current) use of aspirin: Secondary | ICD-10-CM | POA: Diagnosis not present

## 2019-02-07 DIAGNOSIS — E669 Obesity, unspecified: Secondary | ICD-10-CM | POA: Diagnosis not present

## 2019-02-07 DIAGNOSIS — T84050D Periprosthetic osteolysis of internal prosthetic right hip joint, subsequent encounter: Secondary | ICD-10-CM | POA: Diagnosis not present

## 2019-02-07 DIAGNOSIS — Z9181 History of falling: Secondary | ICD-10-CM | POA: Diagnosis not present

## 2019-02-07 DIAGNOSIS — E042 Nontoxic multinodular goiter: Secondary | ICD-10-CM | POA: Diagnosis not present

## 2019-02-07 DIAGNOSIS — Z471 Aftercare following joint replacement surgery: Secondary | ICD-10-CM | POA: Diagnosis not present

## 2019-02-07 DIAGNOSIS — E559 Vitamin D deficiency, unspecified: Secondary | ICD-10-CM | POA: Diagnosis not present

## 2019-02-07 DIAGNOSIS — Z96641 Presence of right artificial hip joint: Secondary | ICD-10-CM | POA: Diagnosis not present

## 2019-02-07 DIAGNOSIS — Z6834 Body mass index (BMI) 34.0-34.9, adult: Secondary | ICD-10-CM | POA: Diagnosis not present

## 2019-02-07 DIAGNOSIS — M533 Sacrococcygeal disorders, not elsewhere classified: Secondary | ICD-10-CM | POA: Diagnosis not present

## 2019-02-10 DIAGNOSIS — J439 Emphysema, unspecified: Secondary | ICD-10-CM | POA: Diagnosis not present

## 2019-02-10 DIAGNOSIS — J45909 Unspecified asthma, uncomplicated: Secondary | ICD-10-CM | POA: Diagnosis not present

## 2019-02-10 DIAGNOSIS — E669 Obesity, unspecified: Secondary | ICD-10-CM | POA: Diagnosis not present

## 2019-02-10 DIAGNOSIS — Z87891 Personal history of nicotine dependence: Secondary | ICD-10-CM | POA: Diagnosis not present

## 2019-02-10 DIAGNOSIS — M1991 Primary osteoarthritis, unspecified site: Secondary | ICD-10-CM | POA: Diagnosis not present

## 2019-02-10 DIAGNOSIS — Z9181 History of falling: Secondary | ICD-10-CM | POA: Diagnosis not present

## 2019-02-10 DIAGNOSIS — I10 Essential (primary) hypertension: Secondary | ICD-10-CM | POA: Diagnosis not present

## 2019-02-10 DIAGNOSIS — M533 Sacrococcygeal disorders, not elsewhere classified: Secondary | ICD-10-CM | POA: Diagnosis not present

## 2019-02-10 DIAGNOSIS — T84050D Periprosthetic osteolysis of internal prosthetic right hip joint, subsequent encounter: Secondary | ICD-10-CM | POA: Diagnosis not present

## 2019-02-10 DIAGNOSIS — Z7982 Long term (current) use of aspirin: Secondary | ICD-10-CM | POA: Diagnosis not present

## 2019-02-10 DIAGNOSIS — E042 Nontoxic multinodular goiter: Secondary | ICD-10-CM | POA: Diagnosis not present

## 2019-02-10 DIAGNOSIS — E785 Hyperlipidemia, unspecified: Secondary | ICD-10-CM | POA: Diagnosis not present

## 2019-02-10 DIAGNOSIS — F419 Anxiety disorder, unspecified: Secondary | ICD-10-CM | POA: Diagnosis not present

## 2019-02-10 DIAGNOSIS — Z6834 Body mass index (BMI) 34.0-34.9, adult: Secondary | ICD-10-CM | POA: Diagnosis not present

## 2019-02-10 DIAGNOSIS — M5136 Other intervertebral disc degeneration, lumbar region: Secondary | ICD-10-CM | POA: Diagnosis not present

## 2019-02-10 DIAGNOSIS — E559 Vitamin D deficiency, unspecified: Secondary | ICD-10-CM | POA: Diagnosis not present

## 2019-02-11 DIAGNOSIS — F419 Anxiety disorder, unspecified: Secondary | ICD-10-CM | POA: Diagnosis not present

## 2019-02-11 DIAGNOSIS — I10 Essential (primary) hypertension: Secondary | ICD-10-CM | POA: Diagnosis not present

## 2019-02-11 DIAGNOSIS — E042 Nontoxic multinodular goiter: Secondary | ICD-10-CM | POA: Diagnosis not present

## 2019-02-11 DIAGNOSIS — J439 Emphysema, unspecified: Secondary | ICD-10-CM | POA: Diagnosis not present

## 2019-02-11 DIAGNOSIS — M533 Sacrococcygeal disorders, not elsewhere classified: Secondary | ICD-10-CM | POA: Diagnosis not present

## 2019-02-11 DIAGNOSIS — M5136 Other intervertebral disc degeneration, lumbar region: Secondary | ICD-10-CM | POA: Diagnosis not present

## 2019-02-11 DIAGNOSIS — J45909 Unspecified asthma, uncomplicated: Secondary | ICD-10-CM | POA: Diagnosis not present

## 2019-02-11 DIAGNOSIS — Z7982 Long term (current) use of aspirin: Secondary | ICD-10-CM | POA: Diagnosis not present

## 2019-02-11 DIAGNOSIS — E785 Hyperlipidemia, unspecified: Secondary | ICD-10-CM | POA: Diagnosis not present

## 2019-02-11 DIAGNOSIS — E669 Obesity, unspecified: Secondary | ICD-10-CM | POA: Diagnosis not present

## 2019-02-11 DIAGNOSIS — Z87891 Personal history of nicotine dependence: Secondary | ICD-10-CM | POA: Diagnosis not present

## 2019-02-11 DIAGNOSIS — Z9181 History of falling: Secondary | ICD-10-CM | POA: Diagnosis not present

## 2019-02-11 DIAGNOSIS — Z6834 Body mass index (BMI) 34.0-34.9, adult: Secondary | ICD-10-CM | POA: Diagnosis not present

## 2019-02-11 DIAGNOSIS — T84050D Periprosthetic osteolysis of internal prosthetic right hip joint, subsequent encounter: Secondary | ICD-10-CM | POA: Diagnosis not present

## 2019-02-11 DIAGNOSIS — E559 Vitamin D deficiency, unspecified: Secondary | ICD-10-CM | POA: Diagnosis not present

## 2019-02-11 DIAGNOSIS — M1991 Primary osteoarthritis, unspecified site: Secondary | ICD-10-CM | POA: Diagnosis not present

## 2019-03-05 DIAGNOSIS — Z96641 Presence of right artificial hip joint: Secondary | ICD-10-CM | POA: Diagnosis not present

## 2019-03-05 DIAGNOSIS — Z471 Aftercare following joint replacement surgery: Secondary | ICD-10-CM | POA: Diagnosis not present

## 2019-04-02 DIAGNOSIS — Z96641 Presence of right artificial hip joint: Secondary | ICD-10-CM | POA: Diagnosis not present

## 2019-04-02 DIAGNOSIS — Z471 Aftercare following joint replacement surgery: Secondary | ICD-10-CM | POA: Diagnosis not present

## 2019-04-11 DIAGNOSIS — M25652 Stiffness of left hip, not elsewhere classified: Secondary | ICD-10-CM | POA: Diagnosis not present

## 2019-04-11 DIAGNOSIS — R29898 Other symptoms and signs involving the musculoskeletal system: Secondary | ICD-10-CM | POA: Insufficient documentation

## 2019-04-24 ENCOUNTER — Telehealth: Payer: Self-pay | Admitting: *Deleted

## 2019-04-24 DIAGNOSIS — M810 Age-related osteoporosis without current pathological fracture: Secondary | ICD-10-CM | POA: Diagnosis not present

## 2019-04-24 DIAGNOSIS — Z6832 Body mass index (BMI) 32.0-32.9, adult: Secondary | ICD-10-CM | POA: Diagnosis not present

## 2019-04-24 DIAGNOSIS — Z1231 Encounter for screening mammogram for malignant neoplasm of breast: Secondary | ICD-10-CM | POA: Diagnosis not present

## 2019-04-24 DIAGNOSIS — Z01419 Encounter for gynecological examination (general) (routine) without abnormal findings: Secondary | ICD-10-CM | POA: Diagnosis not present

## 2019-04-24 DIAGNOSIS — Z13820 Encounter for screening for osteoporosis: Secondary | ICD-10-CM | POA: Diagnosis not present

## 2019-04-24 DIAGNOSIS — M818 Other osteoporosis without current pathological fracture: Secondary | ICD-10-CM | POA: Diagnosis not present

## 2019-04-24 NOTE — Telephone Encounter (Signed)
REACHED BACK OUT PT WHO EARLIER WAS IN DENTIST APPT FOR APPT AND CONSENT   LVMOM TO CALLBACK

## 2019-04-25 ENCOUNTER — Telehealth: Payer: Self-pay | Admitting: Physician Assistant

## 2019-04-25 NOTE — Telephone Encounter (Signed)
° °  5/15 :  Left VM for patient to return call regarding changing Office Visit to Virtual Visit & will also need consent from patient for virtual visit. When patient returns call please transfer to me. Cell #[phone].

## 2019-04-29 ENCOUNTER — Telehealth: Payer: BLUE CROSS/BLUE SHIELD | Admitting: Physician Assistant

## 2019-06-19 ENCOUNTER — Other Ambulatory Visit: Payer: Self-pay | Admitting: Cardiology

## 2019-06-20 NOTE — Telephone Encounter (Signed)
Will route to Dr. Harrington Challenger for review. Pharmacy requesting change from losartan 25 mg due to all strengths of losartan are on backorder.  Requesting to substitute with telmisartan 80 mg.

## 2019-07-23 ENCOUNTER — Telehealth: Payer: Self-pay

## 2019-07-23 ENCOUNTER — Other Ambulatory Visit: Payer: Self-pay

## 2019-07-23 ENCOUNTER — Encounter: Payer: Self-pay | Admitting: Physician Assistant

## 2019-07-23 ENCOUNTER — Telehealth (INDEPENDENT_AMBULATORY_CARE_PROVIDER_SITE_OTHER): Payer: BLUE CROSS/BLUE SHIELD | Admitting: Physician Assistant

## 2019-07-23 VITALS — Ht 64.0 in | Wt 194.0 lb

## 2019-07-23 DIAGNOSIS — I2510 Atherosclerotic heart disease of native coronary artery without angina pectoris: Secondary | ICD-10-CM | POA: Diagnosis not present

## 2019-07-23 DIAGNOSIS — I10 Essential (primary) hypertension: Secondary | ICD-10-CM

## 2019-07-23 DIAGNOSIS — I2584 Coronary atherosclerosis due to calcified coronary lesion: Secondary | ICD-10-CM

## 2019-07-23 DIAGNOSIS — E785 Hyperlipidemia, unspecified: Secondary | ICD-10-CM

## 2019-07-23 MED ORDER — TELMISARTAN 80 MG PO TABS: 80.0000 mg | ORAL_TABLET | Freq: Every day | ORAL | 3 refills | Status: DC

## 2019-07-23 MED ORDER — ROSUVASTATIN CALCIUM 20 MG PO TABS: 20.0000 mg | ORAL_TABLET | Freq: Every day | ORAL | 3 refills | Status: DC

## 2019-07-23 MED ORDER — HYDROCHLOROTHIAZIDE 12.5 MG PO TABS: 12.5000 mg | ORAL_TABLET | Freq: Every day | ORAL | 3 refills | Status: DC

## 2019-07-23 NOTE — Progress Notes (Signed)
Virtual Visit via Video Note   This visit type was conducted due to national recommendations for restrictions regarding the COVID-19 Pandemic (e.g. social distancing) in an effort to limit this patient's exposure and mitigate transmission in our community.  Due to her co-morbid illnesses, this patient is at least at moderate risk for complications without adequate follow up.  This format is felt to be most appropriate for this patient at this time.  All issues noted in this document were discussed and addressed.  A limited physical exam was performed with this format.  Please refer to the patient's chart for her consent to telehealth for [facility].   Date:  07/23/2019   ID:  [name] [name], DOB Feb 14, 1961, MRN [id number]  Patient Location: Home Provider Location: Office  PCP:  Flossie Buffy, NP  Cardiologist:  Dorris Carnes, MD   Electrophysiologist:  None   Evaluation Performed:  Follow-Up Visit  Chief Complaint: Hypertension, hyperlipidemia, chronic calcification  History of Present Illness:    [name] [name] is a 58 y.o. female with:  Coronary calcification  CT 11/19: Calcium score 110 (94th percentile)  Hyperlipidemia  Hypertension  Tobacco use  Ms. Stensland was last seen in 01/2019 for surgical clearance.  Her visit today is for routine follow-up.  Since last seen, she has done well.  She did have her hip surgery and she is feeling better every day.  She has not had any chest discomfort or significant shortness of breath.  She has not had syncope, leg swelling or orthopnea.  The patient does not have symptoms concerning for COVID-19 infection (fever, chills, cough, or new shortness of breath).    Past Medical History:  Diagnosis Date  . Anemia    hx of years ago  . ANXIETY    no meds  . ARTHRITIS   . ASTHMA   . Complication of anesthesia   . DEGENERATIVE JOINT DISEASE   . EMPHYSEMA, MILD    pt. denies at preop  . Family history of adverse reaction to anesthesia    MOM PONV  . GALLBLADDER DISEASE, HX OF   . HYPERLIPIDEMIA   . HYPERTENSION   . Multinodular goiter 05/2001 dx   s/p bx: benign  . Palpitations   . PONV (postoperative nausea and vomiting)   . TOBACCO ABUSE    hx of; quit 1/22016  . Vitamin D deficiency    Past Surgical History:  Procedure Laterality Date  . BREAST BIOPSY    . CHOLECYSTECTOMY  2008  . JOINT REPLACEMENT Right 1998   total hip  . thyroid biopsy  2002  . TOTAL HIP REVISION Right 01/23/2019   Procedure: Revision right total hip arthroplasty with acetabular cup with bone graft;  Surgeon: Paralee Cancel, MD;  Location: [facility];  Service: Orthopedics;  Laterality: Right;  120 mins     Current Meds  Medication Sig  . Ascorbic Acid (VITAMIN C) 250 MG CHEW Chew 250 mg by mouth daily.  . ASPIRIN 81 PO Take 1 tablet by mouth daily.  . Calcium Carbonate-Vitamin D (SM CALCIUM 500/VITAMIN D3 PO) Take 2 tablets by mouth daily.  Marland Kitchen CALCIUM PO Take by mouth.  . hydrochlorothiazide (HYDRODIURIL) 12.5 MG tablet Take 1 tablet (12.5 mg total) by mouth daily.  . Multiple Vitamin (MULTIVITAMIN) tablet Take 1 tablet by mouth daily.    . Multiple Vitamins-Minerals (AIRBORNE PO) Take 2 tablets by mouth daily.  . polyethylene glycol (MIRALAX / GLYCOLAX) packet Take 17 g by mouth 2 (  two) times daily.  . rosuvastatin (CRESTOR) 20 MG tablet Take 1 tablet (20 mg total) by mouth daily.  Marland Kitchen telmisartan (MICARDIS) 80 MG tablet Take 1 tablet (80 mg total) by mouth daily.  Marland Kitchen tetrahydrozoline (VISINE) 0.05 % ophthalmic solution Place 1 drop into both eyes daily as needed (for dry eyes).  . [DISCONTINUED] hydrochlorothiazide (HYDRODIURIL) 12.5 MG tablet Take 12.5 mg by mouth daily.  . [DISCONTINUED] rosuvastatin (CRESTOR) 20 MG tablet TAKE 1 TABLET BY MOUTH EVERY DAY  . [DISCONTINUED] telmisartan (MICARDIS) 80 MG tablet TAKE 1 TABLET DAILY     Allergies:   Codeine   Social History   Tobacco Use  . Smoking status: Former Smoker    Packs/day: 1.00     Years: 15.00    Pack years: 15.00    Types: Cigarettes    Quit date: 12/11/2014    Years since quitting: 4.6  . Smokeless tobacco: Never Used  Substance Use Topics  . Alcohol use: Yes    Alcohol/week: 0.0 standard drinks    Comment: occasional/ social  . Drug use: No     Family Hx: The patient's family history includes Alcohol abuse (age of onset: 54) in her father; Arthritis in her father; Hyperlipidemia in her father and mother; Hypertension in her father and mother; Lung cancer in her paternal grandmother; Lung cancer (age of onset: 71) in her father; Rectal cancer in her maternal grandmother; Stroke (age of onset: 31) in her mother.  ROS:   Please see the history of present illness.     All other systems reviewed and are negative.   Prior CV studies:   The following studies were reviewed today:  Echo 02/01/2018 Mild concentric LVH, EF 60-65, normal wall motion, grade 1 diastolic dysfunction, mild TR, PASP 32  Cardiac CT 12/21/2017 IMPRESSION: Coronary calcium score of 110 . This was 69 th percentile for age and sex matched control.  Myoview 10/20/2011 EF 70, no ischemia, normal study  Labs/Other Tests and Data Reviewed:    EKG:  No ECG reviewed.  Recent Labs: 01/17/2019: ALT 25; TSH 0.69 01/25/2019: BUN 24; Creatinine, Ser 0.70; Hemoglobin 9.6; Platelets 185; Potassium 4.2; Sodium 139   Recent Lipid Panel Lab Results  Component Value Date/Time   CHOL 194 07/30/2017 03:48 PM   TRIG 225.0 (H) 07/30/2017 03:48 PM   TRIG 199 10/13/2009   HDL 44.00 07/30/2017 03:48 PM   CHOLHDL 4 07/30/2017 03:48 PM   LDLCALC 87 03/03/2013 04:29 PM   LDLCALC 188 10/13/2009   LDLDIRECT 126.0 07/30/2017 03:48 PM    Wt Readings from Last 3 Encounters:  07/23/19 194 lb (88 kg)  01/23/19 201 lb 12.8 oz (91.5 kg)  01/16/19 201 lb 12.8 oz (91.5 kg)     Objective:    Vital Signs:  Ht '5\' 4"'  (1.626 m)   Wt 194 lb (88 kg)   BMI 33.30 kg/m    VITAL SIGNS:  reviewed GEN:  no acute  distress EYES:  Sclera anicteric RESPIRATORY:  Normal respiratory effort NEURO:  alert and oriented x 3, no obvious focal deficit PSYCH:  normal affect  ASSESSMENT & PLAN:    1. Coronary artery calcification She is doing well without symptoms to suggest angina.  Continue aggressive risk factor modification.  She notes that she no longer smokes.  Continue aspirin, high-dose statin therapy.  Blood pressure seems to be well controlled.  2. Essential hypertension Blood pressure is usually optimal when she checks it.  We discussed the goal of <130/80.  Continue current dose of telmisartan, HCTZ.  3. Dyslipidemia Continue high-dose statin therapy.  Arrange follow-up lipids, CMET.   Time:   Today, I have spent 7 minutes with the patient with telehealth technology discussing the above problems.     Medication Adjustments/Labs and Tests Ordered: Current medicines are reviewed at length with the patient today.  Concerns regarding medicines are outlined above.   Tests Ordered: Orders Placed This Encounter  Procedures  . Comp Met (CMET)  . Lipid Profile    Medication Changes: Meds ordered this encounter  Medications  . hydrochlorothiazide (HYDRODIURIL) 12.5 MG tablet    Sig: Take 1 tablet (12.5 mg total) by mouth daily.    Dispense:  90 tablet    Refill:  3  . telmisartan (MICARDIS) 80 MG tablet    Sig: Take 1 tablet (80 mg total) by mouth daily.    Dispense:  90 tablet    Refill:  3  . rosuvastatin (CRESTOR) 20 MG tablet    Sig: Take 1 tablet (20 mg total) by mouth daily.    Dispense:  90 tablet    Refill:  3    Follow Up:  Virtual Visit or In Person in 1 year(s)  Signed, Richardson Dopp, PA-C  07/23/2019 5:10 PM    [facility]

## 2019-07-23 NOTE — Patient Instructions (Addendum)
Medication Instructions:  Continue current medications  If you need a refill on your cardiac medications before your next appointment, please call your pharmacy.   Lab work: Please Call [phone] to schedule.  Thank you We will call to arrange a lab visit in the next 2-4 weeks: CMET Fasting lipids  If you have labs (blood work) drawn today and your tests are completely normal, you will receive your results only by: Marland Kitchen MyChart Message (if you have MyChart) OR . A paper copy in the mail If you have any lab test that is abnormal or we need to change your treatment, we will call you to review the results.  Testing/Procedures: None  Follow-Up: At [facility], you and your health needs are our priority.  As part of our continuing mission to provide you with exceptional heart care, we have created designated Provider Care Teams.  These Care Teams include your primary Cardiologist (physician) and Advanced Practice Providers (APPs -  Physician Assistants and Nurse Practitioners) who all work together to provide you with the care you need, when you need it. You will need a follow up appointment in:  12 months.  Please call our office 2 months in advance to schedule this appointment.  You may see Dorris Carnes, MD or Richardson Dopp, PA-C   Any Other Special Instructions Will Be Listed Below (If Applicable). Monitor blood pressure and call us if your blood pressure readings are consistently >130/80

## 2019-07-23 NOTE — Telephone Encounter (Signed)
I spoke to the patient and she will call to arrange lab work date. 2-4 weeks.

## 2019-07-23 NOTE — Telephone Encounter (Signed)
Virtual Visit Pre-Appointment Phone Call  "(Name), I am calling you today to discuss your upcoming appointment. We are currently trying to limit exposure to the virus that causes COVID-19 by seeing patients at home rather than in the office."  1. "What is the BEST phone number to call the day of the visit?" - include this in appointment notes  2. "Do you have or have access to (through a family member/friend) a smartphone with video capability that we can use for your visit?" a. If yes - list this number in appt notes as "cell" (if different from BEST phone #) and list the appointment type as a VIDEO visit in appointment notes b. If no - list the appointment type as a PHONE visit in appointment notes  3. Confirm consent - "In the setting of the current Covid19 crisis, you are scheduled for a (phone or video) visit with your provider on (date) at (time).  Just as we do with many in-office visits, in order for you to participate in this visit, we must obtain consent.  If you'd like, I can send this to your mychart (if signed up) or email for you to review.  Otherwise, I can obtain your verbal consent now.  All virtual visits are billed to your insurance company just like a normal visit would be.  By agreeing to a virtual visit, we'd like you to understand that the technology does not allow for your provider to perform an examination, and thus may limit your provider's ability to fully assess your condition. If your provider identifies any concerns that need to be evaluated in person, we will make arrangements to do so.  Finally, though the technology is pretty good, we cannot assure that it will always work on either your or our end, and in the setting of a video visit, we may have to convert it to a phone-only visit.  In either situation, we cannot ensure that we have a secure connection.  Are you willing to proceed?" STAFF: Did the patient verbally acknowledge consent to telehealth visit? Document  YES/NO here: yes  4. Advise patient to be prepared - "Two hours prior to your appointment, go ahead and check your blood pressure, pulse, oxygen saturation, and your weight (if you have the equipment to check those) and write them all down. When your visit starts, your provider will ask you for this information. If you have an Apple Watch or Kardia device, please plan to have heart rate information ready on the day of your appointment. Please have a pen and paper handy nearby the day of the visit as well."  5. Give patient instructions for MyChart download to smartphone OR [email or web address] as below if video visit (depending on what platform provider is using)  6. Inform patient they will receive a phone call 15 minutes prior to their appointment time (may be from unknown caller ID) so they should be prepared to answer    TELEPHONE CALL NOTE  [place] has been deemed a candidate for a follow-up tele-health visit to limit community exposure during the Covid-19 pandemic. I spoke with the patient via phone to ensure availability of phone/video source, confirm preferred email & phone number, and discuss instructions and expectations.  I reminded [name] [name] to be prepared with any vital sign and/or heart rhythm information that could potentially be obtained via home monitoring, at the time of her visit. I reminded [name] [name] to expect a phone call prior to  her visit.  Jacqlyn KraussMelissa  Issac Moure, [facility] CreekCMA 07/23/2019 1:55 PM   INSTRUCTIONS FOR DOWNLOADING THE MYCHART APP TO SMARTPHONE  - The patient must first make sure to have activated MyChart and know their login information - If Apple, go to [employer] Store and type in MyChart in the search bar and download the app. If Android, ask patient to go to [employer] Play Store and type in AvocaMyChart in the search bar and download the app. The app is free but as with any other app downloads, their phone may require them to verify saved payment information or Apple/Android  password.  - The patient will need to then log into the app with their MyChart username and password, and select [facility] as their healthcare provider to link the account. When it is time for your visit, go to the MyChart app, find appointments, and click Begin Video Visit. Be sure to Select Allow for your device to access the Microphone and Camera for your visit. You will then be connected, and your provider will be with you shortly.  **If they have any issues connecting, or need assistance please contact MyChart service desk [phone])**  **If using a computer, in order to ensure the best quality for their visit they will need to use either of the following Internet Browsers: [employer] Edge, or Google Chrome**  IF USING DOXIMITY or DOXY.ME - The patient will receive a link just prior to their visit by text.     FULL LENGTH CONSENT FOR TELE-HEALTH VISIT   I hereby voluntarily request, consent and authorize [facility] and its employed or contracted physicians, physician assistants, nurse practitioners or other licensed health care professionals (the Practitioner), to provide me with telemedicine health care services (the "Services") as deemed necessary by the treating Practitioner. I acknowledge and consent to receive the Services by the Practitioner via telemedicine. I understand that the telemedicine visit will involve communicating with the Practitioner through live audiovisual communication technology and the disclosure of certain medical information by electronic transmission. I acknowledge that I have been given the opportunity to request an in-person assessment or other available alternative prior to the telemedicine visit and am voluntarily participating in the telemedicine visit.  I understand that I have the right to withhold or withdraw my consent to the use of telemedicine in the course of my care at any time, without affecting my right to future care or treatment,  and that the Practitioner or I may terminate the telemedicine visit at any time. I understand that I have the right to inspect all information obtained and/or recorded in the course of the telemedicine visit and may receive copies of available information for a reasonable fee.  I understand that some of the potential risks of receiving the Services via telemedicine include:  Marland Kitchen. Delay or interruption in medical evaluation due to technological equipment failure or disruption; . Information transmitted may not be sufficient (e.g. poor resolution of images) to allow for appropriate medical decision making by the Practitioner; and/or  . In rare instances, security protocols could fail, causing a breach of personal health information.  Furthermore, I acknowledge that it is my responsibility to provide information about my medical history, conditions and care that is complete and accurate to the best of my ability. I acknowledge that Practitioner's advice, recommendations, and/or decision may be based on factors not within their control, such as incomplete or inaccurate data provided by me or distortions of diagnostic images or specimens that may result from electronic transmissions.  I understand that the practice of medicine is not an exact science and that Practitioner makes no warranties or guarantees regarding treatment outcomes. I acknowledge that I will receive a copy of this consent concurrently upon execution via email to the email address I last provided but may also request a printed copy by calling the office of [place].    I understand that my insurance will be billed for this visit.   I have read or had this consent read to me. . I understand the contents of this consent, which adequately explains the benefits and risks of the Services being provided via telemedicine.  . I have been provided ample opportunity to ask questions regarding this consent and the Services and have had my questions  answered to my satisfaction. . I give my informed consent for the services to be provided through the use of telemedicine in my medical care  By participating in this telemedicine visit I agree to the above.

## 2019-07-25 ENCOUNTER — Telehealth: Payer: Self-pay

## 2019-07-25 DIAGNOSIS — E785 Hyperlipidemia, unspecified: Secondary | ICD-10-CM

## 2019-07-25 NOTE — Telephone Encounter (Signed)
I spoke to the patient and scheduled labs for 9/3. (CMET/FLP).

## 2019-07-29 ENCOUNTER — Other Ambulatory Visit: Payer: Self-pay | Admitting: Internal Medicine

## 2019-08-13 ENCOUNTER — Other Ambulatory Visit: Payer: BLUE CROSS/BLUE SHIELD | Admitting: *Deleted

## 2019-08-13 ENCOUNTER — Other Ambulatory Visit: Payer: Self-pay

## 2019-08-13 DIAGNOSIS — E785 Hyperlipidemia, unspecified: Secondary | ICD-10-CM | POA: Diagnosis not present

## 2019-08-13 LAB — COMPREHENSIVE METABOLIC PANEL
ALT: 24 IU/L (ref 0–32)
AST: 22 IU/L (ref 0–40)
Albumin/Globulin Ratio: 2.5 — ABNORMAL HIGH (ref 1.2–2.2)
Albumin: 4.3 g/dL (ref 3.8–4.9)
Alkaline Phosphatase: 92 IU/L (ref 39–117)
BUN/Creatinine Ratio: 34 — ABNORMAL HIGH (ref 9–23)
BUN: 22 mg/dL (ref 6–24)
Bilirubin Total: 0.6 mg/dL (ref 0.0–1.2)
CO2: 27 mmol/L (ref 20–29)
Calcium: 9.3 mg/dL (ref 8.7–10.2)
Chloride: 99 mmol/L (ref 96–106)
Creatinine, Ser: 0.64 mg/dL (ref 0.57–1.00)
GFR calc Af Amer: 114 mL/min/{1.73_m2} (ref >59)
GFR calc non Af Amer: 99 mL/min/{1.73_m2} (ref >59)
Globulin, Total: 1.7 g/dL (ref 1.5–4.5)
Glucose: 91 mg/dL (ref 65–99)
Potassium: 3.7 mmol/L (ref 3.5–5.2)
Sodium: 141 mmol/L (ref 134–144)
Total Protein: 6 g/dL (ref 6.0–8.5)

## 2019-08-13 LAB — LIPID PANEL
Chol/HDL Ratio: 2.6 ratio (ref 0.0–4.4)
Cholesterol, Total: 127 mg/dL (ref 100–199)
HDL: 48 mg/dL (ref >39)
LDL Chol Calc (NIH): 57 mg/dL (ref 0–99)
Triglycerides: 121 mg/dL (ref 0–149)
VLDL Cholesterol Cal: 22 mg/dL (ref 5–40)

## 2019-08-14 ENCOUNTER — Telehealth: Payer: Self-pay

## 2019-08-14 ENCOUNTER — Other Ambulatory Visit: Payer: BLUE CROSS/BLUE SHIELD

## 2019-08-14 NOTE — Telephone Encounter (Signed)
The patient has been notified of the result and verbalized understanding.  All questions (if any) were answered. Wilma Flavin, RN 08/14/2019 2:06 PM

## 2019-11-02 IMAGING — NM NM BONE 3 PHASE
10 series · 20 of 20 positions shown · non-contrast
Comparison: None

CLINICAL DATA: Right total hip arthroplasty 22 years ago.. Recently
this is become progressively symptomatic.

EXAM:
NUCLEAR MEDICINE 3-PHASE BONE SCAN
TECHNIQUE: Radionuclide angiographic images, immediate static blood pool
images, and 3-hour delayed static images were obtained of the hips
after intravenous injection of radiopharmaceutical.
RADIOPHARMACEUTICALS:  21.6 mCi Pc-YYm MDP IV

[Series 1: flow · 2.07mm/px · 6 of 48 frames shown (1 of 2)]
[frame 5/48]
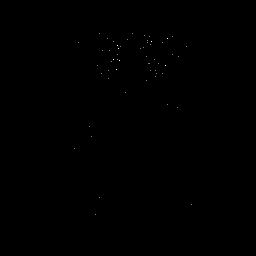
[frame 13/48  full-range]
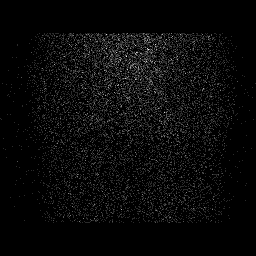
[frame 21/48  full-range]
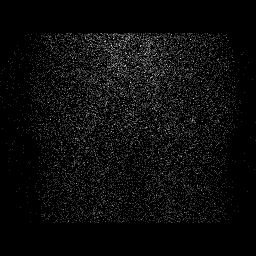
[frame 29/48  full-range]
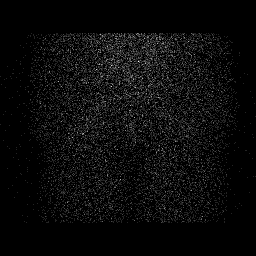
[frame 37/48  full-range]
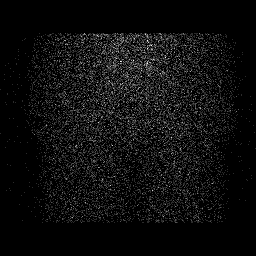
[frame 45/48  full-range]
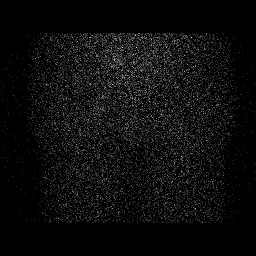

[Series 1: flow · 2.07mm/px · 6 of 48 frames shown (2 of 2)]
[frame 5/48]
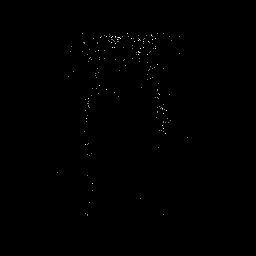
[frame 13/48  full-range]
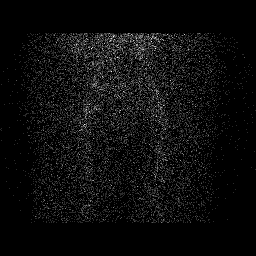
[frame 21/48  full-range]
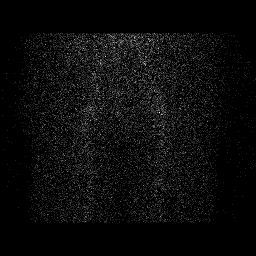
[frame 29/48  full-range]
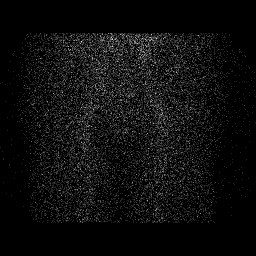
[frame 37/48  full-range]
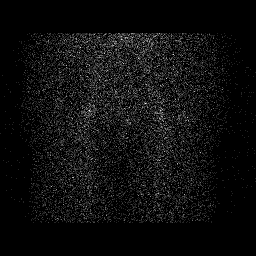
[frame 45/48  full-range]
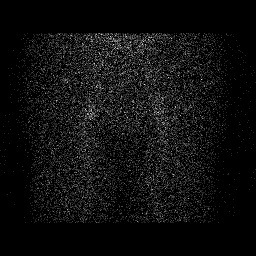

[Series 2: blood pool · 2.07mm/px · 1 of 1 slices shown (1 of 2)]
[im 1/1  full-range]
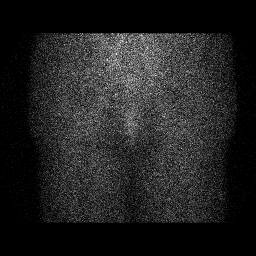

[Series 2: blood pool · 2.07mm/px · 1 of 1 slices shown (2 of 2)]
[im 1/1  full-range]
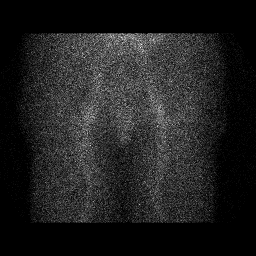

[Series 3: lat bp · 2.07mm/px · 1 of 1 slices shown (1 of 2)]
[im 1/1  full-range]
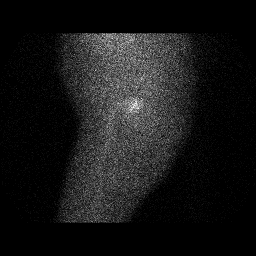

[Series 3: lat bp · 2.07mm/px · 1 of 1 slices shown (2 of 2)]
[im 1/1  full-range]
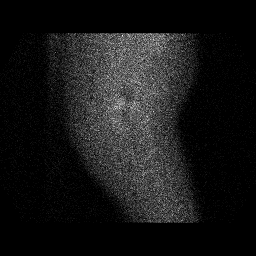

[Series 4: delay · delayed · 2.07mm/px · 1 of 1 slices shown (1 of 4)]
[im 1/1]
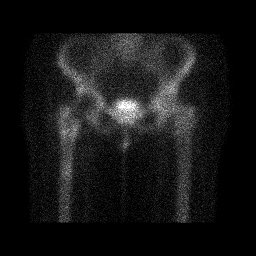

[Series 4: delay · delayed · 2.07mm/px · 1 of 1 slices shown (2 of 4)]
[im 1/1]
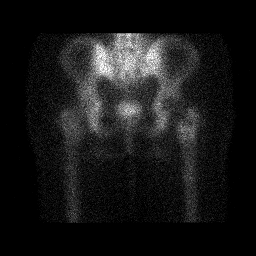

[Series 5: delay · delayed · 2.07mm/px · 1 of 1 slices shown (3 of 4)]
[im 1/1]
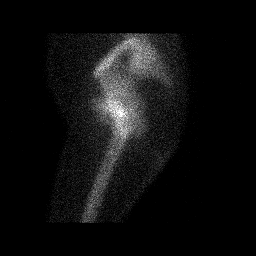

[Series 5: delay · delayed · 2.07mm/px · 1 of 1 slices shown (4 of 4)]
[im 1/1  full-range]
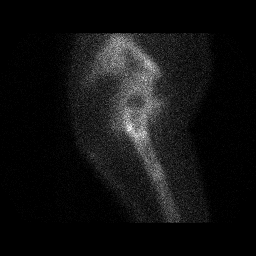

[20 of 20 positions shown; findings below may reference images not displayed]

FINDINGS: Vascular phase: There is symmetric perfusion to both hips. No
abnormal asymmetric increased uptake to the right hip identified.

Blood pool phase: No abnormal blood pool activity identified
localizing to the right hip.

Delayed phase: There is a medium size focus of increased uptake
surrounding the tip stem of the right hip arthroplasty device. There
is also asymmetric delayed uptake localizing to the
intertrochanteric portions of the proximal right femur.
IMPRESSION: 1. No findings identified to suggest arthroplasty device loosening
or infection.
2. Increased uptake localizing to the intertrochanteric portion of
the proximal right femur and around the tip of the right femoral
arthroplasty stem identified on the delayed phase only. This is a
nonspecific finding but is most often associated with bony
remodeling. Correlation with plain film radiographs advised.

## 2020-03-31 IMAGING — DX DG PORTABLE PELVIS
1 series · 1 of 1 positions shown · non-contrast
Comparison: None.

CLINICAL DATA: Right hip arthroplasty

EXAM:
PORTABLE PELVIS 1-2 VIEWS

[pelvis ap]
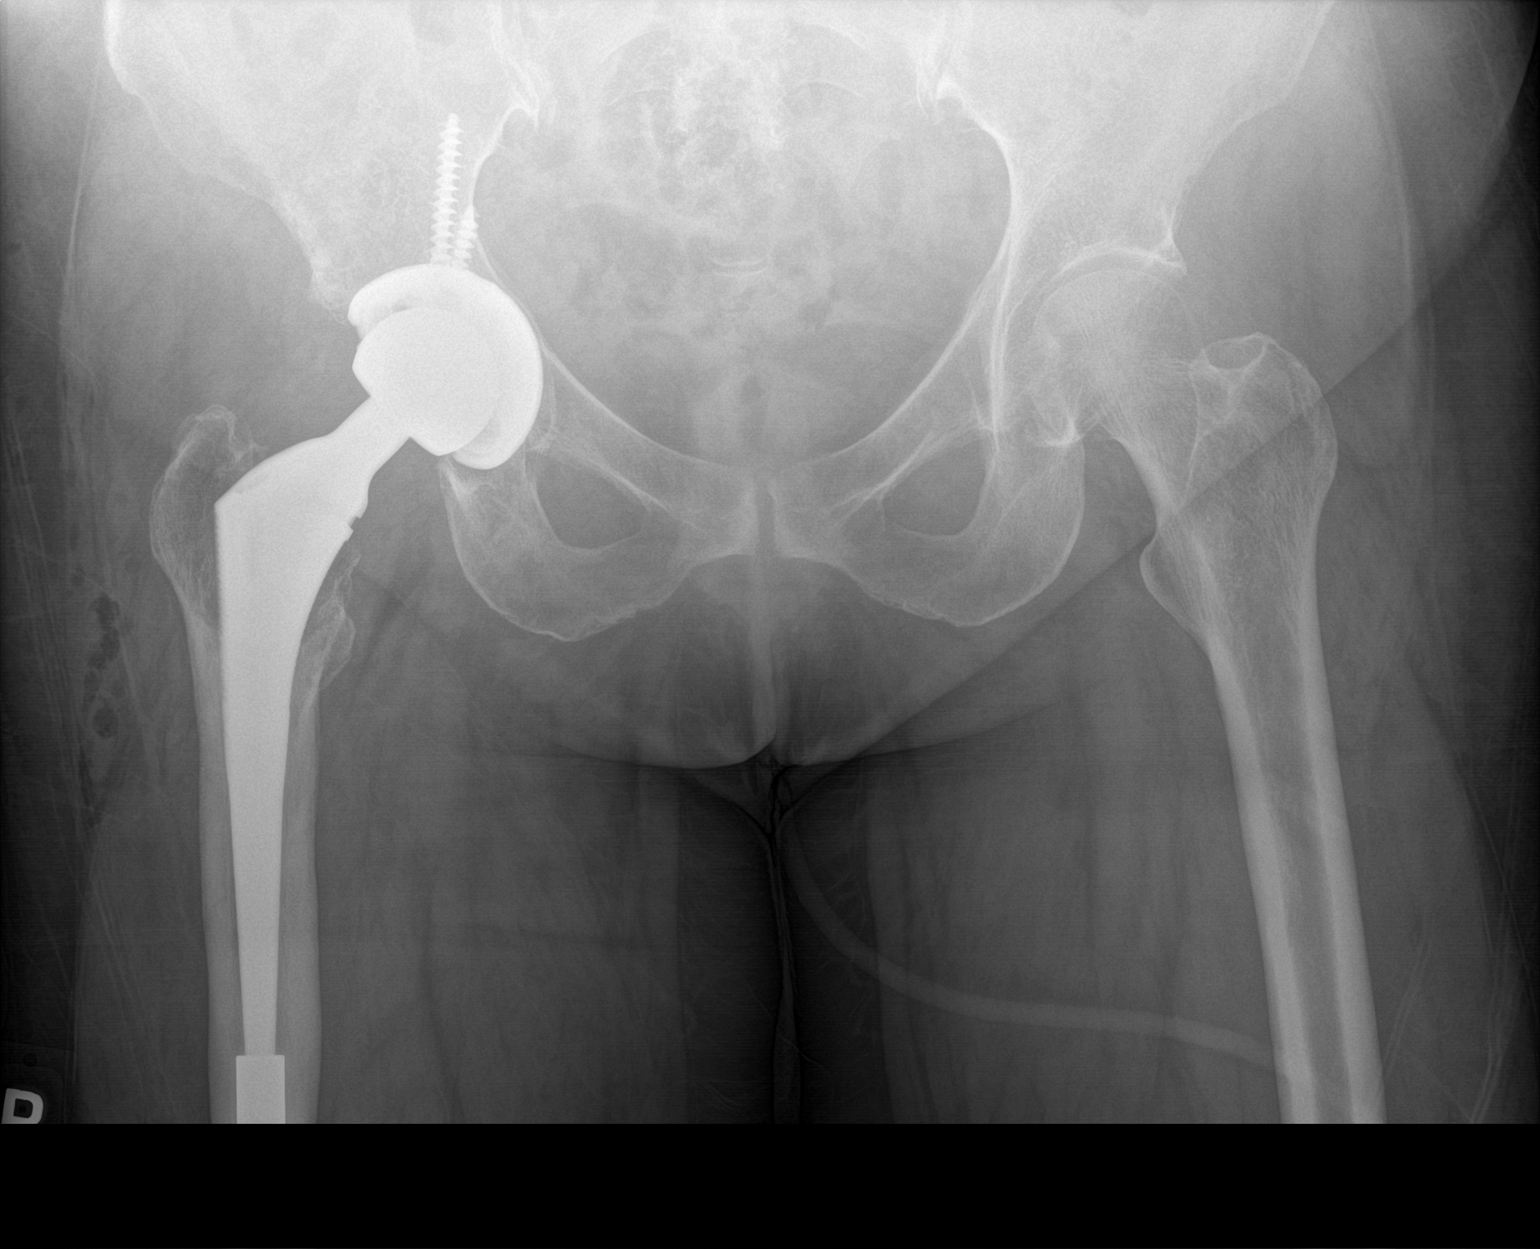

[1 of 1 positions shown; findings below may reference images not displayed]

FINDINGS: Total right hip arthroplasty. No periprosthetic fracture or
dislocation on the single view.
IMPRESSION: Total hip arthroplasty without complicating feature.

## 2020-05-24 ENCOUNTER — Other Ambulatory Visit: Payer: Self-pay | Admitting: Physician Assistant

## 2020-07-22 ENCOUNTER — Other Ambulatory Visit: Payer: Self-pay | Admitting: Physician Assistant

## 2020-08-14 ENCOUNTER — Other Ambulatory Visit: Payer: Self-pay | Admitting: Internal Medicine

## 2020-08-19 ENCOUNTER — Other Ambulatory Visit: Payer: Self-pay | Admitting: Physician Assistant

## 2020-08-25 ENCOUNTER — Telehealth: Payer: Self-pay | Admitting: Internal Medicine

## 2020-08-25 MED ORDER — ROSUVASTATIN CALCIUM 20 MG PO TABS: 20.0000 mg | ORAL_TABLET | Freq: Every day | ORAL | 0 refills | Status: DC

## 2020-08-25 MED ORDER — HYDROCHLOROTHIAZIDE 12.5 MG PO TABS: 12.5000 mg | ORAL_TABLET | Freq: Every day | ORAL | 0 refills | Status: DC

## 2020-08-25 NOTE — Telephone Encounter (Signed)
New Message   *STAT* If patient is at the pharmacy, call can be transferred to refill team.   1. Which medications need to be refilled? (please list name of each medication and dose if known)  rosuvastatin (CRESTOR) 20 MG tablet hydrochlorothiazide (HYDRODIURIL) 12.5 MG tablet  2. Which pharmacy/location (including street and city if local pharmacy) is medication to be sent to? [id number] - [place], [place] - [address]  3. Do they need a 30 day or 90 day supply? 90

## 2020-08-25 NOTE — Telephone Encounter (Signed)
Pt's medication was sent to pt's pharmacy as requested. Confirmation received.  °

## 2020-10-21 ENCOUNTER — Ambulatory Visit: Payer: BLUE CROSS/BLUE SHIELD | Admitting: Internal Medicine

## 2020-11-12 ENCOUNTER — Encounter: Payer: Self-pay | Admitting: Internal Medicine

## 2020-11-12 ENCOUNTER — Ambulatory Visit (INDEPENDENT_AMBULATORY_CARE_PROVIDER_SITE_OTHER): Payer: BLUE CROSS/BLUE SHIELD | Admitting: Internal Medicine

## 2020-11-12 ENCOUNTER — Other Ambulatory Visit: Payer: Self-pay

## 2020-11-12 VITALS — BP 116/60 | HR 82 | Ht 64.0 in | Wt 201.0 lb

## 2020-11-12 DIAGNOSIS — I2584 Coronary atherosclerosis due to calcified coronary lesion: Secondary | ICD-10-CM | POA: Diagnosis not present

## 2020-11-12 DIAGNOSIS — I2510 Atherosclerotic heart disease of native coronary artery without angina pectoris: Secondary | ICD-10-CM

## 2020-11-12 DIAGNOSIS — E785 Hyperlipidemia, unspecified: Secondary | ICD-10-CM

## 2020-11-12 NOTE — Progress Notes (Signed)
Cardiology Office Note   Date:  11/12/2020   ID:  [name] [name], DOB [birth year], MRN [id number]  PCP:  Anne Ng, NP  Cardiologist:   Dietrich Pates, MD   F/U of HTN and lipids    History of Present Illness: [name] [name] is a 59 y.o. female with a history of HTN, HL, coronary calcifications  (2019:  Ca score 110; 94% percentile) She was last seen as a televisit by Wende Mott in AUg 2020  Since seen she says she notes occsaional discomfort in chest that can last hours    Random   Not associated with activity   Breathing is OK   She remains fairly active            Current Meds  Medication Sig  . ASPIRIN 81 PO Take 1 tablet by mouth daily.  . Calcium Carbonate-Vitamin D (SM CALCIUM 500/VITAMIN D3 PO) Take 2 tablets by mouth daily.  Marland Kitchen CALCIUM PO Take by mouth.  . hydrochlorothiazide (HYDRODIURIL) 12.5 MG tablet Take 1 tablet (12.5 mg total) by mouth daily. Please keep upcoming appt in November with Dr. Tenny Craw before anymore refills. Thank you  . Multiple Vitamin (MULTIVITAMIN) tablet Take 1 tablet by mouth daily.    . Multiple Vitamins-Minerals (AIRBORNE PO) Take 2 tablets by mouth daily.  . rosuvastatin (CRESTOR) 20 MG tablet Take 1 tablet (20 mg total) by mouth daily. Please keep upcoming appt in November with Dr. Tenny Craw before anymore refills. Thank you  . telmisartan (MICARDIS) 80 MG tablet Take 1 tablet (80 mg total) by mouth daily.  Marland Kitchen tetrahydrozoline (VISINE) 0.05 % ophthalmic solution Place 1 drop into both eyes daily as needed (for dry eyes).     Allergies:   Codeine   Past Medical History:  Diagnosis Date  . Anemia    hx of years ago  . ANXIETY    no meds  . ARTHRITIS   . ASTHMA   . Complication of anesthesia   . DEGENERATIVE JOINT DISEASE   . EMPHYSEMA, MILD    pt. denies at preop  . Family history of adverse reaction to anesthesia    MOM PONV  . GALLBLADDER DISEASE, HX OF   . HYPERLIPIDEMIA   . HYPERTENSION   . Multinodular goiter 05/2001 dx   s/p bx:  benign  . Palpitations   . PONV (postoperative nausea and vomiting)   . TOBACCO ABUSE    hx of; quit 1/22016  . Vitamin D deficiency     Past Surgical History:  Procedure Laterality Date  . BREAST BIOPSY    . CHOLECYSTECTOMY  2008  . JOINT REPLACEMENT Right 1998   total hip  . thyroid biopsy  2002  . TOTAL HIP REVISION Right 01/23/2019   Procedure: Revision right total hip arthroplasty with acetabular cup with bone graft;  Surgeon: Durene Romans, MD;  Location: [facility];  Service: Orthopedics;  Laterality: Right;  120 mins     Social History:  The patient  reports that she quit smoking about 5 years ago. Her smoking use included cigarettes. She has a 15.00 pack-year smoking history. She has never used smokeless tobacco. She reports current alcohol use. She reports that she does not use drugs.   Family History:  The patient's family history includes Alcohol abuse (age of onset: 66) in her father; Arthritis in her father; Hyperlipidemia in her father and mother; Hypertension in her father and mother; Lung cancer in her paternal grandmother; Lung cancer (age of onset:  65) in her father; Rectal cancer in her maternal grandmother; Stroke (age of onset: 38) in her mother.    ROS:  Please see the history of present illness. All other systems are reviewed and  Negative to the above problem except as noted.    PHYSICAL EXAM: VS:  BP 116/60   Pulse 82   Ht 5\' 4"  (1.626 m)   Wt 201 lb (91.2 kg)   SpO2 94%   BMI 34.50 kg/m   GEN: Obese 59 yo, in no acute distress  HEENT: normal  Neck: no JVD, no carotid bruits Cardiac: RRR; no murmurs.  No LE  edema  Respiratory:  clear to auscultation bilaterally,  GI: soft, nontender, nondistended, + BS  No hepatomegaly  MS: no deformity Moving all extremities   Skin: warm and dry, no rash Neuro:  Strength and sensation are intact Psych: euthymic mood, full affect   EKG:  EKG is not ordered today.   Lipid Panel    Component Value Date/Time    CHOL 127 08/13/2019 0741   TRIG 121 08/13/2019 0741   TRIG 199 10/13/2009 0000   HDL 48 08/13/2019 0741   CHOLHDL 2.6 08/13/2019 0741   CHOLHDL 4 07/30/2017 1548   VLDL 45.0 (H) 07/30/2017 1548   LDLCALC 57 08/13/2019 0741   LDLCALC 188 10/13/2009 0000   LDLDIRECT 126.0 07/30/2017 1548      Wt Readings from Last 3 Encounters:  11/12/20 201 lb (91.2 kg)  07/23/19 194 lb (88 kg)  01/23/19 201 lb 12.8 oz (91.5 kg)      ASSESSMENT AND PLAN:  1  HTN  BP controlled  Keep following   Keep on current regien   Checl BMET  2  CP   Atypical   I do not think spells (erratci) represent angina  3  Coronary calcifications   Discussed risk factor modifications  4  Hx HL   Keep on Crestor   Check lipids    5  Obesity  Discussed diets (sugars, timed restricted eating)     F/U in 10 months   Current medicines are reviewed at length with the patient today.  The patient does not have concerns regarding medicines.  Signed, 01/25/19, MD  11/12/2020 3:47 PM    [facility] [address] [place], [place], [place]  [place] Phone: [phone]; Fax: [phone]

## 2020-11-12 NOTE — Patient Instructions (Signed)
Medication Instructions:  No changes *If you need a refill on your cardiac medications before your next appointment, please call your pharmacy*   Lab Work: Today: cbc, bmet, lipids  If you have labs (blood work) drawn today and your tests are completely normal, you will receive your results only by: Marland Kitchen MyChart Message (if you have MyChart) OR . A paper copy in the mail If you have any lab test that is abnormal or we need to change your treatment, we will call you to review the results.   Testing/Procedures: none   Follow-Up: At [facility], you and your health needs are our priority.  As part of our continuing mission to provide you with exceptional heart care, we have created designated Provider Care Teams.  These Care Teams include your primary Cardiologist (physician) and Advanced Practice Providers (APPs -  Physician Assistants and Nurse Practitioners) who all work together to provide you with the care you need, when you need it.  We recommend signing up for the patient portal called "MyChart".  Sign up information is provided on this After Visit Summary.  MyChart is used to connect with patients for Virtual Visits (Telemedicine).  Patients are able to view lab/test results, encounter notes, upcoming appointments, etc.  Non-urgent messages can be sent to your provider as well.   To learn more about what you can do with MyChart, go to [email or web address].    Your next appointment:   10 month(s)  The format for your next appointment:   In Person  Provider:   You may see Dietrich Pates, MD or one of the following Advanced Practice Providers on your designated Care Team:    Tereso Newcomer, PA-C  Chelsea Aus, [place]    Other Instructions

## 2020-11-13 LAB — BASIC METABOLIC PANEL
BUN/Creatinine Ratio: 19 (ref 12–28)
BUN: 17 mg/dL (ref 8–27)
CO2: 24 mmol/L (ref 20–29)
Calcium: 9.2 mg/dL (ref 8.7–10.3)
Chloride: 103 mmol/L (ref 96–106)
Creatinine, Ser: 0.88 mg/dL (ref 0.57–1.00)
GFR calc Af Amer: 83 mL/min/{1.73_m2} (ref >59)
GFR calc non Af Amer: 72 mL/min/{1.73_m2} (ref >59)
Glucose: 100 mg/dL — ABNORMAL HIGH (ref 65–99)
Potassium: 3.9 mmol/L (ref 3.5–5.2)
Sodium: 143 mmol/L (ref 134–144)

## 2020-11-13 LAB — CBC
Hematocrit: 38.8 % (ref 34.0–46.6)
Hemoglobin: 13 g/dL (ref 11.1–15.9)
MCH: 31 pg (ref 26.6–33.0)
MCHC: 33.5 g/dL (ref 31.5–35.7)
MCV: 92 fL (ref 79–97)
Platelets: 228 10*3/uL (ref 150–450)
RBC: 4.2 x10E6/uL (ref 3.77–5.28)
RDW: 13 % (ref 11.7–15.4)
WBC: 7.7 10*3/uL (ref 3.4–10.8)

## 2020-11-13 LAB — LIPID PANEL
Chol/HDL Ratio: 3 ratio (ref 0.0–4.4)
Cholesterol, Total: 137 mg/dL (ref 100–199)
HDL: 46 mg/dL (ref >39)
LDL Chol Calc (NIH): 66 mg/dL (ref 0–99)
Triglycerides: 146 mg/dL (ref 0–149)
VLDL Cholesterol Cal: 25 mg/dL (ref 5–40)

## 2020-11-18 ENCOUNTER — Other Ambulatory Visit: Payer: Self-pay | Admitting: Internal Medicine

## 2021-01-10 DIAGNOSIS — Z1231 Encounter for screening mammogram for malignant neoplasm of breast: Secondary | ICD-10-CM | POA: Diagnosis not present

## 2021-01-10 DIAGNOSIS — Z6834 Body mass index (BMI) 34.0-34.9, adult: Secondary | ICD-10-CM | POA: Diagnosis not present

## 2021-01-10 DIAGNOSIS — Z01419 Encounter for gynecological examination (general) (routine) without abnormal findings: Secondary | ICD-10-CM | POA: Diagnosis not present

## 2021-02-15 DIAGNOSIS — N840 Polyp of corpus uteri: Secondary | ICD-10-CM | POA: Diagnosis not present

## 2021-02-15 DIAGNOSIS — N841 Polyp of cervix uteri: Secondary | ICD-10-CM | POA: Diagnosis not present

## 2021-02-17 DIAGNOSIS — K648 Other hemorrhoids: Secondary | ICD-10-CM | POA: Diagnosis not present

## 2021-02-17 DIAGNOSIS — K625 Hemorrhage of anus and rectum: Secondary | ICD-10-CM | POA: Diagnosis not present

## 2021-03-06 ENCOUNTER — Other Ambulatory Visit: Payer: Self-pay | Admitting: Physician Assistant

## 2021-03-16 ENCOUNTER — Telehealth: Payer: Self-pay | Admitting: *Deleted

## 2021-03-16 NOTE — Telephone Encounter (Signed)
   Patient Name: [name] [name]  DOB: [birth year]  MRN: [id number]   Primary Cardiologist: Dietrich Pates, MD  Chart reviewed as part of pre-operative protocol coverage. Patient was contacted today for pre-op evaluation and reports doing well since last visit with Dr. Tenny Craw in 11/2020. She denies any chest pain, shortness of breath, palpitations, near syncope/syncope, or acute CHF symptoms. Able to complete >4. without any problems. Given past medical history and time since last visit, based on ACC/AHA guidelines, Elyanah L Bahri would be at acceptable risk for the planned procedure without further cardiovascular testing.   Patient has never had MI or CVA and is on Aspirin for prevention. Therefore, OK to hold Aspirin if needed prior to colonoscopy. Recommend restarting this as soon as able following procedure.  I will route this recommendation to the requesting party via Epic fax function and remove from pre-op pool.  Please call with questions.  Corrin Parker, PA-C 03/16/2021, 11:27 AM

## 2021-03-16 NOTE — Telephone Encounter (Signed)
   [facility] Pre-operative Risk Assessment    Patient Name: [name] [name]  DOB: [birth year]  MRN: [id number]   HEARTCARE STAFF: - Please ensure there is not already an duplicate clearance open for this procedure. - Under Visit Info/Reason for Call, type in Other and utilize the format Clearance MM/DD/YY or Clearance TBD. Do not use dashes or single digits. - If request is for dental extraction, please clarify the # of teeth to be extracted.  Request for surgical clearance:  1. What type of surgery is being performed? COLONOSCOPY   2. When is this surgery scheduled? 06/02/21   3. What type of clearance is required (medical clearance vs. Pharmacy clearance to hold med vs. Both)? MEDICAL  4. Are there any medications that need to be held prior to surgery and how long? ASA    5. Practice name and name of physician performing surgery? [facility]; DR. Paulita Fujita   6. What is the office phone number? [phone]   7.   What is the office fax number? [phone]  8.   Anesthesia type (None, local, MAC, general) ? PROPOFOL   Julaine Hua 03/16/2021, 10:54 AM  _________________________________________________________________   (provider comments below)

## 2021-03-29 DIAGNOSIS — M79671 Pain in right foot: Secondary | ICD-10-CM | POA: Diagnosis not present

## 2021-03-29 DIAGNOSIS — M25551 Pain in right hip: Secondary | ICD-10-CM | POA: Diagnosis not present

## 2021-06-02 DIAGNOSIS — K5730 Diverticulosis of large intestine without perforation or abscess without bleeding: Secondary | ICD-10-CM | POA: Diagnosis not present

## 2021-06-02 DIAGNOSIS — K921 Melena: Secondary | ICD-10-CM | POA: Diagnosis not present

## 2021-11-20 ENCOUNTER — Other Ambulatory Visit: Payer: Self-pay | Admitting: Internal Medicine

## 2021-11-30 ENCOUNTER — Other Ambulatory Visit: Payer: Self-pay | Admitting: Physician Assistant

## 2021-12-17 ENCOUNTER — Other Ambulatory Visit: Payer: Self-pay | Admitting: Internal Medicine

## 2021-12-22 ENCOUNTER — Encounter: Payer: Self-pay | Admitting: Nurse Practitioner

## 2021-12-22 ENCOUNTER — Other Ambulatory Visit: Payer: Self-pay

## 2021-12-22 ENCOUNTER — Ambulatory Visit (INDEPENDENT_AMBULATORY_CARE_PROVIDER_SITE_OTHER): Payer: BLUE CROSS/BLUE SHIELD | Admitting: Nurse Practitioner

## 2021-12-22 VITALS — BP 114/62 | HR 76 | Temp 97.6°F | Ht 64.5 in | Wt 198.6 lb

## 2021-12-22 DIAGNOSIS — R0789 Other chest pain: Secondary | ICD-10-CM

## 2021-12-22 LAB — BASIC METABOLIC PANEL
BUN: 13 mg/dL (ref 6–23)
CO2: 32 mEq/L (ref 19–32)
Calcium: 9.1 mg/dL (ref 8.4–10.5)
Chloride: 102 mEq/L (ref 96–112)
Creatinine, Ser: 0.64 mg/dL (ref 0.40–1.20)
GFR: 95.46 mL/min (ref >60.00)
Glucose, Bld: 127 mg/dL — ABNORMAL HIGH (ref 70–99)
Potassium: 3.7 mEq/L (ref 3.5–5.1)
Sodium: 141 mEq/L (ref 135–145)

## 2021-12-22 LAB — TROPONIN I (HIGH SENSITIVITY): High Sens Troponin I: 3 ng/L (ref 2–17)

## 2021-12-22 LAB — TSH: TSH: 1.26 u[IU]/mL (ref 0.35–5.50)

## 2021-12-22 NOTE — Patient Instructions (Signed)
ECG: normal Go to lab for blood draw. Schedule f/up appt with cardiology.  Nonspecific Chest Pain Chest pain can be caused by many different conditions. Some causes of chest pain can be life-threatening. These will require treatment right away. Serious causes of chest pain include: Heart attack. A tear in the body's main blood vessel. Redness and swelling (inflammation) around your heart. Blood clot in your lungs. Other causes of chest pain may not be so serious. These include: Heartburn. Anxiety or stress. Damage to bones or muscles in your chest. Lung infections. Chest pain can feel like: Pain or discomfort in your chest. Crushing, pressure, aching, or squeezing pain. Burning or tingling. Dull or sharp pain that is worse when you move, cough, or take a deep breath. Pain or discomfort that is also felt in your back, neck, jaw, shoulder, or arm, or pain that spreads to any of these areas. It is hard to know whether your pain is caused by something that is serious or something that is not so serious. So it is important to see your doctor right away if you have chest pain. Follow these instructions at home: Medicines Take over-the-counter and prescription medicines only as told by your doctor. If you were prescribed an antibiotic medicine, take it as told by your doctor. Do not stop taking the antibiotic even if you start to feel better. Lifestyle  Rest as told by your doctor. Do not use any products that contain nicotine or tobacco, such as cigarettes, e-cigarettes, and chewing tobacco. If you need help quitting, ask your doctor. Do not drink alcohol. Make lifestyle changes as told by your doctor. These may include: Getting regular exercise. Ask your doctor what activities are safe for you. Eating a heart-healthy diet. A diet and nutrition specialist (dietitian) can help you to learn healthy eating options. Staying at a healthy weight. Treating diabetes or high blood pressure, if  needed. Lowering your stress. Activities such as yoga and relaxation techniques can help. General instructions Pay attention to any changes in your symptoms. Tell your doctor about them or any new symptoms. Avoid any activities that cause chest pain. Keep all follow-up visits as told by your doctor. This is important. You may need more testing if your chest pain does not go away. Contact a doctor if: Your chest pain does not go away. You feel depressed. You have a fever. Get help right away if: Your chest pain is worse. You have a cough that gets worse, or you cough up blood. You have very bad (severe) pain in your belly (abdomen). You pass out (faint). You have either of these for no clear reason: Sudden chest discomfort. Sudden discomfort in your arms, back, neck, or jaw. You have shortness of breath at any time. You suddenly start to sweat, or your skin gets clammy. You feel sick to your stomach (nauseous). You throw up (vomit). You suddenly feel lightheaded or dizzy. You feel very weak or tired. Your heart starts to beat fast, or it feels like it is skipping beats. These symptoms may be an emergency. Do not wait to see if the symptoms will go away. Get medical help right away. Call your local emergency services (911 in the [place] Do not drive yourself to the hospital. Summary Chest pain can be caused by many different conditions. The cause may be serious and need treatment right away. If you have chest pain, see your doctor right away. Follow your doctor's instructions for taking medicines and making lifestyle changes. Keep  all follow-up visits as told by your doctor. This includes visits for any further testing if your chest pain does not go away. Be sure to know the signs that show that your condition has become worse. Get help right away if you have these symptoms. This information is not intended to replace advice given to you by your health care provider. Make sure you discuss  any questions you have with your health care provider. Document Revised: 02/10/2021 Document Reviewed: 02/10/2021 Elsevier Patient Education  2022 [employer].

## 2021-12-22 NOTE — Progress Notes (Signed)
Subjective:  Patient ID: [name] [name], female    DOB: 1961-02-22  Age: 61 y.o. MRN: [id number]  CC: Acute Visit (Pt would like to discuss anxiety. Pt states yesterday she experienced some tightness in her chest and irregular breathing but she was worked up and has been very stressed lately. Pt states this does happen on and off and she noticed it is often when she is stressed and would like to look into this. )  Chest Pain  This is a new problem. The current episode started yesterday. The onset quality is sudden. The problem occurs rarely. The problem has been resolved. The pain is present in the substernal region. The pain is moderate. The quality of the pain is described as dull. The pain radiates to the left arm. Pertinent negatives include no claudication, diaphoresis, dizziness, exertional chest pressure, fever, headaches, irregular heartbeat, lower extremity edema, malaise/fatigue, nausea, near-syncope, numbness, orthopnea, palpitations, PND, shortness of breath or weakness. The pain is aggravated by nothing. Treatments tried: aspirin 325mg . The treatment provided significant relief. Risk factors include lack of exercise, stress, sedentary lifestyle and post-menopausal.  Her past medical history is significant for CAD and hypertension. Prior diagnostic workup includes echocardiogram (CT coronary calcium score at 100.).  Onset of chest discomfort at rest yesterday afternoon. Denies any chest pain today. No GERD, no recent URI Current daily use of crestor, micardis, and aspirin  BP Readings from Last 3 Encounters:  12/22/21 114/62  11/12/20 116/60  01/26/19 119/71    Reviewed past Medical, Social and Family history today.  Outpatient Medications Prior to Visit  Medication Sig Dispense Refill   ASPIRIN 81 PO Take 1 tablet by mouth daily.     CALCIUM PO Take by mouth.     hydrochlorothiazide (HYDRODIURIL) 12.5 MG tablet TAKE 1 TABLET (12.5 MG TOTAL) BY MOUTH DAILY. PLEASE CONTACT OFFICE FOR  ADDITIONAL REFILLS 30 tablet 0   Multiple Vitamin (MULTIVITAMIN) tablet Take 1 tablet by mouth daily.       Multiple Vitamins-Minerals (AIRBORNE PO) Take 2 tablets by mouth daily.     rosuvastatin (CRESTOR) 20 MG tablet TAKE 1 TABLET (20 MG TOTAL) BY MOUTH DAILY. PLEASE CONTACT OFFICE FOR ADDITIONAL REFILLS 30 tablet 0   telmisartan (MICARDIS) 80 MG tablet TAKE 1 TABLET BY MOUTH EVERY DAY 30 tablet 0   tetrahydrozoline 0.05 % ophthalmic solution Place 1 drop into both eyes daily as needed (for dry eyes).     Calcium Carbonate-Vitamin D (SM CALCIUM 500/VITAMIN D3 PO) Take 2 tablets by mouth daily. (Patient not taking: Reported on 12/22/2021)     No facility-administered medications prior to visit.   ROS See HPI  Objective:  BP 114/62 (BP Location: Left Arm, Patient Position: Sitting, Cuff Size: Large)    Pulse 76    Temp 97.6 F (36.4 C) (Temporal)    Ht 5' 4.5" (1.638 m)    Wt 198 lb 9.6 oz (90.1 kg)    SpO2 97%    BMI 33.56 kg/m   ECG: NSR, no change compared to 2020 ECG.  Physical Exam Vitals reviewed.  Cardiovascular:     Rate and Rhythm: Normal rate and regular rhythm.     Pulses: Normal pulses.     Heart sounds: Normal heart sounds.  Pulmonary:     Effort: Pulmonary effort is normal.     Breath sounds: Normal breath sounds.  Abdominal:     Tenderness: There is no abdominal tenderness.  Musculoskeletal:     Right lower leg:  No edema.     Left lower leg: No edema.  Neurological:     Mental Status: She is alert and oriented to person, place, and time.  Psychiatric:        Mood and Affect: Mood normal.        Behavior: Behavior normal.        Thought Content: Thought content normal.   Assessment & Plan:  This visit occurred during the SARS-CoV-2 public health emergency.  Safety protocols were in place, including screening questions prior to the visit, additional usage of staff PPE, and extensive cleaning of exam room while observing appropriate contact time as indicated for  disinfecting solutions.   Chrissie was seen today for acute visit.  Diagnoses and all orders for this visit:  Atypical chest pain -     Basic metabolic panel -     TSH -     EKG 12-Lead -     Troponin I (High Sensitivity)  Provided ED precautions. Normal ECG. Negative troponin Normal BMP and TSH Symptoms could be due to GERD?  Problem List Items Addressed This Visit   None Visit Diagnoses     Atypical chest pain    -  Primary   Relevant Orders   Basic metabolic panel   TSH   EKG 12-Lead (Completed)   Troponin I (High Sensitivity)       Follow-up: Return in about 6 months (around 06/21/2022) for CPE (fasting).  Alysia Penna, NP

## 2021-12-26 ENCOUNTER — Telehealth: Payer: Self-pay | Admitting: Internal Medicine

## 2021-12-26 ENCOUNTER — Other Ambulatory Visit: Payer: Self-pay | Admitting: Internal Medicine

## 2021-12-26 NOTE — Telephone Encounter (Signed)
° ° °*  STAT* If patient is at the pharmacy, call can be transferred to refill team.   1. Which medications need to be refilled? (please list name of each medication and dose if known) telmisartan (MICARDIS) 80 MG tablet  2. Which pharmacy/location (including street and city if local pharmacy) is medication to be sent to? TAKE 1 TABLET BY MOUTH EVERY DAY  3. Do they need a 30 day or 90 day supply? 30 days  Pt made an appt with Dr. Tenny Craw on 02/20/22

## 2021-12-27 MED ORDER — TELMISARTAN 80 MG PO TABS: 80.0000 mg | ORAL_TABLET | Freq: Every day | ORAL | 1 refills | Status: DC

## 2021-12-27 NOTE — Telephone Encounter (Signed)
Pt's medication was sent to pt's pharmacy as requested. Confirmation received.  °

## 2022-01-17 ENCOUNTER — Other Ambulatory Visit: Payer: Self-pay | Admitting: Internal Medicine

## 2022-01-18 ENCOUNTER — Other Ambulatory Visit: Payer: Self-pay | Admitting: Internal Medicine

## 2022-02-17 ENCOUNTER — Other Ambulatory Visit: Payer: Self-pay | Admitting: Internal Medicine

## 2022-02-19 NOTE — Progress Notes (Deleted)
? ?Cardiology Office Note ? ? ?Date:  02/19/2022  ? ?ID:  [name] [name], DOB 1961-07-17, MRN [id number] ? ?PCP:  Flossie Buffy, NP  ?Cardiologist:   Dorris Carnes, MD  ? ? ? ?  ?History of Present Illness: ?[name] [name] is a 61 y.o. female with a history of ? ? ? ? ? ? ?No outpatient medications have been marked as taking for the 02/20/22 encounter (Appointment) with Fay Records, MD.  ? ? ? ?Allergies:   Codeine  ? ?Past Medical History:  ?Diagnosis Date  ? Anemia   ? hx of years ago  ? ANXIETY   ? no meds  ? ARTHRITIS   ? ASTHMA   ? Complication of anesthesia   ? DEGENERATIVE JOINT DISEASE   ? EMPHYSEMA, MILD   ? pt. denies at preop  ? Family history of adverse reaction to anesthesia   ? MOM PONV  ? GALLBLADDER DISEASE, HX OF   ? HYPERLIPIDEMIA   ? HYPERTENSION   ? Multinodular goiter 05/2001 dx  ? s/p bx: benign  ? Palpitations   ? PONV (postoperative nausea and vomiting)   ? TOBACCO ABUSE   ? hx of; quit 1/22016  ? Vitamin D deficiency   ? ? ?Past Surgical History:  ?Procedure Laterality Date  ? BREAST BIOPSY    ? CHOLECYSTECTOMY  2008  ? JOINT REPLACEMENT Right 1998  ? total hip  ? thyroid biopsy  2002  ? TOTAL HIP REVISION Right 01/23/2019  ? Procedure: Revision right total hip arthroplasty with acetabular cup with bone graft;  Surgeon: Paralee Cancel, MD;  Location: [facility];  Service: Orthopedics;  Laterality: Right;  120 mins  ? ? ? ?Social History:  The patient  reports that she quit smoking about 7 years ago. Her smoking use included cigarettes. She has a 15.00 pack-year smoking history. She has never used smokeless tobacco. She reports current alcohol use. She reports that she does not use drugs.  ? ?Family History:  The patient's family history includes Alcohol abuse (age of onset: 44) in her father; Arthritis in her father; Hyperlipidemia in her father and mother; Hypertension in her father and mother; Lung cancer in her paternal grandmother; Lung cancer (age of onset: 84) in her father; Rectal cancer in her  maternal grandmother; Stroke (age of onset: 75) in her mother.  ? ? ?ROS:  Please see the history of present illness. All other systems are reviewed and  Negative to the above problem except as noted.  ? ? ?PHYSICAL EXAM: ?VS:  There were no vitals taken for this visit.  ?GEN: Well nourished, well developed, in no acute distress  ?HEENT: normal  ?Neck: no JVD, carotid bruits, or masses ?Cardiac: RRR; no murmurs, rubs, or gallops,no edema  ?Respiratory:  clear to auscultation bilaterally, normal work of breathing ?GI: soft, nontender, nondistended, + BS  No hepatomegaly  ?MS: no deformity Moving all extremities   ?Skin: warm and dry, no rash ?Neuro:  Strength and sensation are intact ?Psych: euthymic mood, full affect ? ? ?EKG:  EKG is ordered today. ? ? ?Lipid Panel ?   ?Component Value Date/Time  ? CHOL 137 11/12/2020 1607  ? TRIG 146 11/12/2020 1607  ? TRIG 199 10/13/2009 0000  ? HDL 46 11/12/2020 1607  ? CHOLHDL 3.0 11/12/2020 1607  ? CHOLHDL 4 07/30/2017 1548  ? VLDL 45.0 (H) 07/30/2017 1548  ? [place] 66 11/12/2020 1607  ? [place] 188 10/13/2009 0000  ? LDLDIRECT 126.0  07/30/2017 1548  ? ?  ? ?Wt Readings from Last 3 Encounters:  ?12/22/21 198 lb 9.6 oz (90.1 kg)  ?11/12/20 201 lb (91.2 kg)  ?07/23/19 194 lb (88 kg)  ?  ? ? ?ASSESSMENT AND PLAN: ? ? ? ? ?Current medicines are reviewed at length with the patient today.  The patient does not have concerns regarding medicines. ? ?Signed, ?Dorris Carnes, MD  ?02/19/2022 10:36 PM    ?[facility] ?[place], [place], [place]  [address] ?Phone: [phone]; Fax: [phone]  ?  ? ?Cardiology Office Note ? ? ?Date:  02/19/2022  ? ?ID:  [name] [name], DOB 06-19-1961, MRN [id number] ? ?PCP:  Flossie Buffy, NP  ?Cardiologist:   Dorris Carnes, MD  ? ?F/U of HTN and lipids  ?  ?History of Present Illness: ?[name] [name] is a 61 y.o. female with a history of HTN, HL, coronary calcifications  (2019:  Ca score 110; 94% percentile) ?She was last seen as a  televisit by Kathleen Argue in [place] 2020 ? ?Since seen she says she notes occsaional discomfort in chest that can last hours    Random   Not associated with activity   Breathing is OK   She remains fairly active    ? ? ? ? ? ? ? ? ?No outpatient medications have been marked as taking for the 02/20/22 encounter (Appointment) with Fay Records, MD.  ? ? ? ?Allergies:   Codeine  ? ?Past Medical History:  ?Diagnosis Date  ? Anemia   ? hx of years ago  ? ANXIETY   ? no meds  ? ARTHRITIS   ? ASTHMA   ? Complication of anesthesia   ? DEGENERATIVE JOINT DISEASE   ? EMPHYSEMA, MILD   ? pt. denies at preop  ? Family history of adverse reaction to anesthesia   ? MOM PONV  ? GALLBLADDER DISEASE, HX OF   ? HYPERLIPIDEMIA   ? HYPERTENSION   ? Multinodular goiter 05/2001 dx  ? s/p bx: benign  ? Palpitations   ? PONV (postoperative nausea and vomiting)   ? TOBACCO ABUSE   ? hx of; quit 1/22016  ? Vitamin D deficiency   ? ? ?Past Surgical History:  ?Procedure Laterality Date  ? BREAST BIOPSY    ? CHOLECYSTECTOMY  2008  ? JOINT REPLACEMENT Right 1998  ? total hip  ? thyroid biopsy  2002  ? TOTAL HIP REVISION Right 01/23/2019  ? Procedure: Revision right total hip arthroplasty with acetabular cup with bone graft;  Surgeon: Paralee Cancel, MD;  Location: [facility];  Service: Orthopedics;  Laterality: Right;  120 mins  ? ? ? ?Social History:  The patient  reports that she quit smoking about 7 years ago. Her smoking use included cigarettes. She has a 15.00 pack-year smoking history. She has never used smokeless tobacco. She reports current alcohol use. She reports that she does not use drugs.  ? ?Family History:  The patient's family history includes Alcohol abuse (age of onset: 52) in her father; Arthritis in her father; Hyperlipidemia in her father and mother; Hypertension in her father and mother; Lung cancer in her paternal grandmother; Lung cancer (age of onset: 4) in her father; Rectal cancer in her maternal grandmother; Stroke (age of onset:  38) in her mother.  ? ? ?ROS:  Please see the history of present illness. All other systems are reviewed and  Negative to the above problem except as  noted.  ? ? ?PHYSICAL EXAM: ?VS:  There were no vitals taken for this visit.  ?GEN: Obese 61 yo, in no acute distress  ?HEENT: normal  ?Neck: no JVD, no carotid bruits ?Cardiac: RRR; no murmurs.  No LE  edema  ?Respiratory:  clear to auscultation bilaterally,  ?GI: soft, nontender, nondistended, + BS  No hepatomegaly  ?MS: no deformity Moving all extremities   ?Skin: warm and dry, no rash ?Neuro:  Strength and sensation are intact ?Psych: euthymic mood, full affect ? ? ?EKG:  EKG is not ordered today. ? ? ?Lipid Panel ?   ?Component Value Date/Time  ? CHOL 137 11/12/2020 1607  ? TRIG 146 11/12/2020 1607  ? TRIG 199 10/13/2009 0000  ? HDL 46 11/12/2020 1607  ? CHOLHDL 3.0 11/12/2020 1607  ? CHOLHDL 4 07/30/2017 1548  ? VLDL 45.0 (H) 07/30/2017 1548  ? [place] 66 11/12/2020 1607  ? [place] 188 10/13/2009 0000  ? LDLDIRECT 126.0 07/30/2017 1548  ? ?  ? ?Wt Readings from Last 3 Encounters:  ?12/22/21 198 lb 9.6 oz (90.1 kg)  ?11/12/20 201 lb (91.2 kg)  ?07/23/19 194 lb (88 kg)  ?  ? ? ?ASSESSMENT AND PLAN: ? ?1  HTN  BP controlled  Keep following   Keep on current regien   Checl BMET ? ?2  CP   Atypical   I do not think spells (erratci) represent angina ? ?3  Coronary calcifications   Discussed risk factor modifications ? ?4  Hx HL   Keep on Crestor   Check lipids   ? ?5  Obesity  Discussed diets (sugars, timed restricted eating)   ? ? ?F/U in 10 months   ?Current medicines are reviewed at length with the patient today.  The patient does not have concerns regarding medicines. ? ?Signed, ?Dorris Carnes, MD  ?02/19/2022 8:33 PM    ?[facility] ?[place], [place], [place]  [address] ?Phone: [phone]; Fax: [phone]  ? ? ?

## 2022-02-20 ENCOUNTER — Encounter: Payer: Self-pay | Admitting: Internal Medicine

## 2022-02-20 ENCOUNTER — Other Ambulatory Visit: Payer: Self-pay

## 2022-02-20 ENCOUNTER — Ambulatory Visit: Payer: BLUE CROSS/BLUE SHIELD | Admitting: Internal Medicine

## 2022-02-20 ENCOUNTER — Ambulatory Visit (INDEPENDENT_AMBULATORY_CARE_PROVIDER_SITE_OTHER): Payer: BLUE CROSS/BLUE SHIELD | Admitting: Internal Medicine

## 2022-02-20 VITALS — BP 107/70 | HR 64 | Ht 64.5 in | Wt 197.0 lb

## 2022-02-20 DIAGNOSIS — I2510 Atherosclerotic heart disease of native coronary artery without angina pectoris: Secondary | ICD-10-CM

## 2022-02-20 NOTE — Patient Instructions (Signed)
Medication Instructions:  Your physician recommends that you continue on your current medications as directed. Please refer to the Current Medication list given to you today.  *If you need a refill on your cardiac medications before your next appointment, please call your pharmacy*   Lab Work: none If you have labs (blood work) drawn today and your tests are completely normal, you will receive your results only by: MyChart Message (if you have MyChart) OR A paper copy in the mail If you have any lab test that is abnormal or we need to change your treatment, we will call you to review the results.   Testing/Procedures: none   Follow-Up: At [facility], you and your health needs are our priority.  As part of our continuing mission to provide you with exceptional heart care, we have created designated Provider Care Teams.  These Care Teams include your primary Cardiologist (physician) and Advanced Practice Providers (APPs -  Physician Assistants and Nurse Practitioners) who all work together to provide you with the care you need, when you need it.  We recommend signing up for the patient portal called "MyChart".  Sign up information is provided on this After Visit Summary.  MyChart is used to connect with patients for Virtual Visits (Telemedicine).  Patients are able to view lab/test results, encounter notes, upcoming appointments, etc.  Non-urgent messages can be sent to your provider as well.   To learn more about what you can do with MyChart, go to [email or web address].    Your next appointment:   1 year(s)  The format for your next appointment:   In Person  Provider:   Paula Ross, MD     Other Instructions   

## 2022-02-20 NOTE — Progress Notes (Unsigned)
Cardiology Office Note   Date:  02/20/2022   ID:  [name] [name], DOB [birth year], MRN [id number]  PCP:  Anne Ng, NP  Cardiologist:   Dietrich Pates, MD   F/U of HTN and lipids    History of Present Illness: [name] [name] is a 61 y.o. female with a history of HTN, HL, coronary calcifications  (2019:  Ca score 110; 94% percentile) She was last seen as a televisit by Wende Mott in [place] 2020  Since seen she says she notes occsaional discomfort in chest that can last hours    Random   Not associated with activity   Breathing is OK   She remains fairly active     Pt in Jan 2023 had dull CP     Seen by PCP     Acitve   Walks over 10000 steps     No SOB   No CP R hip problem   Replaced   Not right      Current Meds  Medication Sig   ASPIRIN 81 PO Take 1 tablet by mouth daily.   CALCIUM PO Take by mouth.   hydrochlorothiazide (HYDRODIURIL) 12.5 MG tablet TAKE 1 TABLET BY MOUTH EVERY DAY   Multiple Vitamin (MULTIVITAMIN) tablet Take 1 tablet by mouth daily.     Multiple Vitamins-Minerals (AIRBORNE PO) Take 2 tablets by mouth daily.   rosuvastatin (CRESTOR) 20 MG tablet Take 1 tablet (20 mg total) by mouth daily.   telmisartan (MICARDIS) 80 MG tablet TAKE 1 TABLET BY MOUTH EVERY DAY   tetrahydrozoline 0.05 % ophthalmic solution Place 1 drop into both eyes daily as needed (for dry eyes).     Allergies:   Codeine   Past Medical History:  Diagnosis Date   Anemia    hx of years ago   ANXIETY    no meds   ARTHRITIS    ASTHMA    Complication of anesthesia    DEGENERATIVE JOINT DISEASE    EMPHYSEMA, MILD    pt. denies at preop   Family history of adverse reaction to anesthesia    MOM PONV   GALLBLADDER DISEASE, HX OF    HYPERLIPIDEMIA    HYPERTENSION    Multinodular goiter 05/2001 dx   s/p bx: benign   Palpitations    PONV (postoperative nausea and vomiting)    TOBACCO ABUSE    hx of; quit 1/22016   Vitamin D deficiency     Past Surgical History:  Procedure  Laterality Date   BREAST BIOPSY     CHOLECYSTECTOMY  2008   JOINT REPLACEMENT Right 1998   total hip   thyroid biopsy  2002   TOTAL HIP REVISION Right 01/23/2019   Procedure: Revision right total hip arthroplasty with acetabular cup with bone graft;  Surgeon: Durene Romans, MD;  Location: [facility];  Service: Orthopedics;  Laterality: Right;  120 mins     Social History:  The patient  reports that she quit smoking about 7 years ago. Her smoking use included cigarettes. She has a 15.00 pack-year smoking history. She has never used smokeless tobacco. She reports current alcohol use. She reports that she does not use drugs.   Family History:  The patient's family history includes Alcohol abuse (age of onset: 72) in her father; Arthritis in her father; Hyperlipidemia in her father and mother; Hypertension in her father and mother; Lung cancer in her paternal grandmother; Lung cancer (age of onset: 64) in her father; Rectal cancer  in her maternal grandmother; Stroke (age of onset: 80) in her mother.    ROS:  Please see the history of present illness. All other systems are reviewed and  Negative to the above problem except as noted.    PHYSICAL EXAM: VS:  BP 107/70    Pulse 64    Ht 5' 4.5" (1.638 m)    Wt 197 lb (89.4 kg)    BMI 33.29 kg/m   GEN: Obese 61 yo, in no acute distress  HEENT: normal  Neck: no JVD, no carotid bruits Cardiac: RRR; no murmurs.  No LE  edema  Respiratory:  clear to auscultation bilaterally,  GI: soft, nontender, nondistended, + BS  No hepatomegaly  MS: no deformity Moving all extremities   Skin: warm and dry, no rash Neuro:  Strength and sensation are intact Psych: euthymic mood, full affect   EKG:  EKG is not ordered today.   Lipid Panel    Component Value Date/Time   CHOL 137 11/12/2020 1607   TRIG 146 11/12/2020 1607   TRIG 199 10/13/2009 0000   HDL 46 11/12/2020 1607   CHOLHDL 3.0 11/12/2020 1607   CHOLHDL 4 07/30/2017 1548   VLDL 45.0 (H) 07/30/2017  1548   LDLCALC 66 11/12/2020 1607   LDLCALC 188 10/13/2009 0000   LDLDIRECT 126.0 07/30/2017 1548      Wt Readings from Last 3 Encounters:  02/20/22 197 lb (89.4 kg)  12/22/21 198 lb 9.6 oz (90.1 kg)  11/12/20 201 lb (91.2 kg)      ASSESSMENT AND PLAN:  1  HTN  BP controlled  Keep following   Keep on current regien   Checl BMET  2  CP   Atypical   I do not think spells (erratci) represent angina  3  Coronary calcifications   Discussed risk factor modifications  4  Hx HL   Keep on Crestor   Check lipids    5  Obesity  Discussed diets (sugars, timed restricted eating)     F/U in 10 months   Current medicines are reviewed at length with the patient today.  The patient does not have concerns regarding medicines.  Signed, Dietrich Pates, MD  02/20/2022 9:23 AM    [facility] [address] [place], [place], [place]  [address] Phone: [phone]; Fax: [phone]

## 2022-02-22 ENCOUNTER — Telehealth: Payer: Self-pay | Admitting: *Deleted

## 2022-02-22 ENCOUNTER — Other Ambulatory Visit: Payer: Self-pay | Admitting: Internal Medicine

## 2022-02-22 NOTE — Telephone Encounter (Signed)
Medication alternative request forward to nurse for approval from MD. ?

## 2022-02-22 NOTE — Telephone Encounter (Signed)
[facility], [place], [place], [place] [phone] requesting alternative medication change from rosuvastatin (Crestor) 20 mg to atorvastatin (Lipitor) 40 mg tablet #90 with refills. Requested change due to insurance alternative. Thank you ?

## 2022-02-23 MED ORDER — ATORVASTATIN CALCIUM 40 MG PO TABS: 40.0000 mg | ORAL_TABLET | Freq: Every day | ORAL | 3 refills | Status: DC

## 2022-02-23 NOTE — Telephone Encounter (Signed)
Per Dr. Tenny Craw, okay to make switch. Sent in lipitor 40 mg daily and discontinued crestor.  ?

## 2022-03-02 ENCOUNTER — Other Ambulatory Visit: Payer: Self-pay

## 2022-03-02 MED ORDER — HYDROCHLOROTHIAZIDE 12.5 MG PO TABS: 12.5000 mg | ORAL_TABLET | Freq: Every day | ORAL | 3 refills | Status: DC

## 2022-03-02 NOTE — Telephone Encounter (Signed)
Pt's medication was sent to pt's pharmacy as requested. Confirmation received.  °

## 2022-04-13 DIAGNOSIS — L814 Other melanin hyperpigmentation: Secondary | ICD-10-CM | POA: Diagnosis not present

## 2022-04-13 DIAGNOSIS — L821 Other seborrheic keratosis: Secondary | ICD-10-CM | POA: Diagnosis not present

## 2022-04-13 DIAGNOSIS — D1801 Hemangioma of skin and subcutaneous tissue: Secondary | ICD-10-CM | POA: Diagnosis not present

## 2022-04-25 DIAGNOSIS — H02831 Dermatochalasis of right upper eyelid: Secondary | ICD-10-CM | POA: Diagnosis not present

## 2022-04-25 DIAGNOSIS — H02834 Dermatochalasis of left upper eyelid: Secondary | ICD-10-CM | POA: Diagnosis not present

## 2022-05-04 DIAGNOSIS — Z6833 Body mass index (BMI) 33.0-33.9, adult: Secondary | ICD-10-CM | POA: Diagnosis not present

## 2022-05-04 DIAGNOSIS — M810 Age-related osteoporosis without current pathological fracture: Secondary | ICD-10-CM | POA: Insufficient documentation

## 2022-05-04 DIAGNOSIS — Z01419 Encounter for gynecological examination (general) (routine) without abnormal findings: Secondary | ICD-10-CM | POA: Diagnosis not present

## 2022-05-24 ENCOUNTER — Ambulatory Visit (INDEPENDENT_AMBULATORY_CARE_PROVIDER_SITE_OTHER): Payer: BLUE CROSS/BLUE SHIELD | Admitting: Nurse Practitioner

## 2022-05-24 ENCOUNTER — Encounter: Payer: Self-pay | Admitting: Nurse Practitioner

## 2022-05-24 VITALS — BP 122/78 | HR 66 | Temp 97.5°F | Ht 64.5 in | Wt 192.2 lb

## 2022-05-24 DIAGNOSIS — F5101 Primary insomnia: Secondary | ICD-10-CM | POA: Diagnosis not present

## 2022-05-24 DIAGNOSIS — R739 Hyperglycemia, unspecified: Secondary | ICD-10-CM

## 2022-05-24 DIAGNOSIS — E559 Vitamin D deficiency, unspecified: Secondary | ICD-10-CM

## 2022-05-24 DIAGNOSIS — R197 Diarrhea, unspecified: Secondary | ICD-10-CM | POA: Diagnosis not present

## 2022-05-24 DIAGNOSIS — E042 Nontoxic multinodular goiter: Secondary | ICD-10-CM

## 2022-05-24 LAB — COMPREHENSIVE METABOLIC PANEL
ALT: 17 U/L (ref 0–35)
AST: 15 U/L (ref 0–37)
Albumin: 4.2 g/dL (ref 3.5–5.2)
Alkaline Phosphatase: 71 U/L (ref 39–117)
BUN: 13 mg/dL (ref 6–23)
CO2: 33 mEq/L — ABNORMAL HIGH (ref 19–32)
Calcium: 9.2 mg/dL (ref 8.4–10.5)
Chloride: 103 mEq/L (ref 96–112)
Creatinine, Ser: 0.62 mg/dL (ref 0.40–1.20)
GFR: 95.91 mL/min (ref >60.00)
Glucose, Bld: 101 mg/dL — ABNORMAL HIGH (ref 70–99)
Potassium: 3.2 mEq/L — ABNORMAL LOW (ref 3.5–5.1)
Sodium: 143 mEq/L (ref 135–145)
Total Bilirubin: 0.9 mg/dL (ref 0.2–1.2)
Total Protein: 6.4 g/dL (ref 6.0–8.3)

## 2022-05-24 LAB — CBC
HCT: 38.4 % (ref 36.0–46.0)
Hemoglobin: 13.1 g/dL (ref 12.0–15.0)
MCHC: 34 g/dL (ref 30.0–36.0)
MCV: 91.5 fl (ref 78.0–100.0)
Platelets: 196 10*3/uL (ref 150.0–400.0)
RBC: 4.2 Mil/uL (ref 3.87–5.11)
RDW: 13.8 % (ref 11.5–15.5)
WBC: 5.4 10*3/uL (ref 4.0–10.5)

## 2022-05-24 LAB — HEMOGLOBIN A1C: Hgb A1c MFr Bld: 5.6 % (ref 4.6–6.5)

## 2022-05-24 LAB — VITAMIN D 25 HYDROXY (VIT D DEFICIENCY, FRACTURES): VITD: 51.43 ng/mL (ref 30.00–100.00)

## 2022-05-24 NOTE — Patient Instructions (Addendum)
Go to lab  Stop all OTC supplements Start probiotic (align or florastor or curturelle) 1cap BID x 1week, then 1cap daily  Take HCTZ in AM to prevent nocturia  Ok to use melatonin 10mg  at bedtime

## 2022-05-24 NOTE — Progress Notes (Deleted)
.  acute

## 2022-05-24 NOTE — Assessment & Plan Note (Signed)
Repeat vit. D °

## 2022-05-24 NOTE — Assessment & Plan Note (Signed)
Repeat TSH 12/2021: normal

## 2022-05-24 NOTE — Progress Notes (Signed)
Acute Office Visit  Subjective:    Patient ID: [name] [name], female    DOB: 1961/07/30, 61 y.o.   MRN: [id number]  Chief Complaint  Patient presents with   Acute Visit    C/o lower abd discomfort Hasn't had a complete bowel movement in 3 weeks, loose stools any time she'll use the restroom. Pap records requested from GYN   Diarrhea  This is a new problem. The current episode started 1 to 4 weeks ago (3weeks). The problem occurs 2 to 4 times per day. The problem has been unchanged. The stool consistency is described as Watery. The patient states that diarrhea does not awaken her from sleep. Associated symptoms include abdominal pain and bloating. Pertinent negatives include no arthralgias, chills, coughing, fever, headaches, increased  flatus, myalgias, sweats, URI, vomiting or weight loss. Nothing aggravates the symptoms. There are no known risk factors. She has tried nothing for the symptoms. There is no history of bowel resection, inflammatory bowel disease, irritable bowel syndrome, malabsorption, a recent abdominal surgery or short gut syndrome.  Last colonoscopy 02/2022 by [facility]orsfall physicians: no polyps, diverticulosis present per patient. Report requested. She start taking magnesium supplement in last 3weeks to help with muscle cramps. Multinodular goiter Repeat TSH 12/2021: normal  Vitamin D deficiency Repeat vit D  Hyperglycemia Impaired fasting glucose : 120-130 Check hgbA1c  Primary insomnia Interrupted sleep: wakes up at bedtime to urinate, unable to return to sleep due to racing thoughts.   Use of tylenol PM with significant improvement, but does not want to use this longterm This has led to need for daytime naps and fatigue.  Advise to use melatonin 5-10mg  at bedtime Take HCT in AM instead of PM to minimal nocturia   Outpatient Medications Prior to Visit  Medication Sig   ASPIRIN 81 PO Take 1 tablet by mouth daily.   atorvastatin (LIPITOR) 40 MG tablet Take 1 tablet  (40 mg total) by mouth daily.   CALCIUM PO Take by mouth.   hydrochlorothiazide (HYDRODIURIL) 12.5 MG tablet Take 1 tablet (12.5 mg total) by mouth daily.   Multiple Vitamin (MULTIVITAMIN) tablet Take 1 tablet by mouth daily.     Multiple Vitamins-Minerals (AIRBORNE PO) Take 2 tablets by mouth daily.   telmisartan (MICARDIS) 80 MG tablet TAKE 1 TABLET BY MOUTH EVERY DAY   tetrahydrozoline 0.05 % ophthalmic solution Place 1 drop into both eyes daily as needed (for dry eyes).   No facility-administered medications prior to visit.   Reviewed past medical and social history.  Review of Systems  Constitutional:  Negative for chills, fever and weight loss.  Respiratory:  Negative for cough.   Gastrointestinal:  Positive for abdominal pain, bloating and diarrhea. Negative for abdominal distention, anal bleeding, blood in stool, constipation, flatus, nausea, rectal pain and vomiting.  Musculoskeletal:  Negative for arthralgias and myalgias.  Neurological:  Negative for headaches.   Per HPI     Objective:    Physical Exam Vitals reviewed.  Cardiovascular:     Rate and Rhythm: Normal rate.     Pulses: Normal pulses.  Pulmonary:     Effort: Pulmonary effort is normal.  Abdominal:     General: Bowel sounds are normal. There is no distension.     Palpations: Abdomen is soft.     Tenderness: There is no abdominal tenderness. There is no right CVA tenderness, left CVA tenderness or guarding.  Neurological:     Mental Status: She is oriented to person, place, and time.  Psychiatric:        Mood and Affect: Mood normal.        Behavior: Behavior normal.    BP 122/78 (BP Location: Right Arm, Patient Position: Sitting, Cuff Size: Normal)   Pulse 66   Temp (!) 97.5 F (36.4 C) (Temporal)   Ht 5' 4.5" (1.638 m)   Wt 192 lb 3.2 oz (87.2 kg)   SpO2 92%   BMI 32.48 kg/m  BP Readings from Last 3 Encounters:  05/24/22 122/78  02/20/22 107/70  12/22/21 114/62   Wt Readings from Last 3  Encounters:  05/24/22 192 lb 3.2 oz (87.2 kg)  02/20/22 197 lb (89.4 kg)  12/22/21 198 lb 9.6 oz (90.1 kg)   Results for orders placed or performed in visit on 05/24/22  CBC  Result Value Ref Range   WBC 5.4 4.0 - 10.5 K/uL   RBC 4.20 3.87 - 5.11 Mil/uL   Platelets 196.0 150.0 - 400.0 K/uL   Hemoglobin 13.1 12.0 - 15.0 g/dL   HCT 38.4 36.0 - 46.0 %   MCV 91.5 78.0 - 100.0 fl   MCHC 34.0 30.0 - 36.0 g/dL   RDW 13.8 11.5 - 15.5 %  Comprehensive metabolic panel  Result Value Ref Range   Sodium 143 135 - 145 mEq/L   Potassium 3.2 (L) 3.5 - 5.1 mEq/L   Chloride 103 96 - 112 mEq/L   CO2 33 (H) 19 - 32 mEq/L   Glucose, Bld 101 (H) 70 - 99 mg/dL   BUN 13 6 - 23 mg/dL   Creatinine, Ser 0.62 0.40 - 1.20 mg/dL   Total Bilirubin 0.9 0.2 - 1.2 mg/dL   Alkaline Phosphatase 71 39 - 117 U/L   AST 15 0 - 37 U/L   ALT 17 0 - 35 U/L   Total Protein 6.4 6.0 - 8.3 g/dL   Albumin 4.2 3.5 - 5.2 g/dL   GFR 95.91 >60.00 mL/min   Calcium 9.2 8.4 - 10.5 mg/dL  Vitamin D (25 hydroxy)  Result Value Ref Range   VITD 51.43 30.00 - 100.00 ng/mL  Hemoglobin A1c  Result Value Ref Range   Hgb A1c MFr Bld 5.6 4.6 - 6.5 %       Assessment & Plan:  Collect stool for culture and c.diff Stop all OTC supplements Start probiotic (align or florastor or curturelle) 1cap BID x 1week, then 1cap daily  Problem List Items Addressed This Visit       Endocrine   Multinodular goiter    Repeat TSH 12/2021: normal        Other   Hyperglycemia    Impaired fasting glucose : 120-130 Check hgbA1c      Relevant Orders   Hemoglobin A1c (Completed)   Primary insomnia    Interrupted sleep: wakes up at bedtime to urinate, unable to return to sleep due to racing thoughts.   Use of tylenol PM with significant improvement, but does not want to use this longterm This has led to need for daytime naps and fatigue.  Advise to use melatonin 5-10mg  at bedtime Take HCT in AM instead of PM to minimal nocturia       Relevant Orders   CBC (Completed)   Comprehensive metabolic panel (Completed)   Vitamin D deficiency    Repeat vit D      Relevant Orders   Vitamin D (25 hydroxy) (Completed)   Other Visit Diagnoses     Diarrhea, unspecified type    -  Primary  Relevant Orders   CBC (Completed)   Comprehensive metabolic panel (Completed)   Stool Culture   C. difficile GDH and Toxin A/B      No orders of the defined types were placed in this encounter.  Return if symptoms worsen or fail to improve.  Wilfred Lacy, NP

## 2022-05-24 NOTE — Assessment & Plan Note (Signed)
Impaired fasting glucose : 120-130 Check hgbA1c

## 2022-05-24 NOTE — Assessment & Plan Note (Addendum)
Interrupted sleep: wakes up at bedtime to urinate, unable to return to sleep due to racing thoughts.   Use of tylenol PM with significant improvement, but does not want to use this longterm This has led to need for daytime naps and fatigue.  Advise to use melatonin 5-10mg  at bedtime Take HCT in AM instead of PM to minimal nocturia

## 2022-05-26 LAB — C. DIFFICILE GDH AND TOXIN A/B
GDH ANTIGEN: NOT DETECTED
MICRO NUMBER:: 13530504
SPECIMEN QUALITY:: ADEQUATE
TOXIN A AND B: NOT DETECTED

## 2022-05-28 LAB — STOOL CULTURE: E coli, Shiga toxin Assay: NEGATIVE

## 2022-05-31 NOTE — Addendum Note (Signed)
Addended by: Michaela Corner on: 05/31/2022 02:48 PM   Modules accepted: Orders

## 2022-07-17 DIAGNOSIS — H02831 Dermatochalasis of right upper eyelid: Secondary | ICD-10-CM | POA: Diagnosis not present

## 2022-08-08 DIAGNOSIS — Z13820 Encounter for screening for osteoporosis: Secondary | ICD-10-CM | POA: Diagnosis not present

## 2022-08-08 DIAGNOSIS — Z1231 Encounter for screening mammogram for malignant neoplasm of breast: Secondary | ICD-10-CM | POA: Diagnosis not present

## 2022-08-08 DIAGNOSIS — M810 Age-related osteoporosis without current pathological fracture: Secondary | ICD-10-CM | POA: Diagnosis not present

## 2023-01-30 ENCOUNTER — Telehealth: Payer: Self-pay | Admitting: Nurse Practitioner

## 2023-01-30 ENCOUNTER — Encounter (HOSPITAL_COMMUNITY): Payer: Self-pay

## 2023-01-30 ENCOUNTER — Emergency Department (HOSPITAL_COMMUNITY)
Admission: EM | Admit: 2023-01-30 | Discharge: 2023-01-30 | Disposition: A | Discharge status: Home / Self Care | Payer: BLUE CROSS/BLUE SHIELD | Attending: Emergency Medicine | Admitting: Emergency Medicine

## 2023-01-30 ENCOUNTER — Emergency Department (HOSPITAL_COMMUNITY): Payer: BLUE CROSS/BLUE SHIELD

## 2023-01-30 DIAGNOSIS — M79602 Pain in left arm: Secondary | ICD-10-CM | POA: Diagnosis not present

## 2023-01-30 DIAGNOSIS — R079 Chest pain, unspecified: Secondary | ICD-10-CM | POA: Diagnosis not present

## 2023-01-30 DIAGNOSIS — R0789 Other chest pain: Secondary | ICD-10-CM | POA: Diagnosis not present

## 2023-01-30 LAB — CBC
HCT: 40 % (ref 36.0–46.0)
Hemoglobin: 13.3 g/dL (ref 12.0–15.0)
MCH: 31.2 pg (ref 26.0–34.0)
MCHC: 33.3 g/dL (ref 30.0–36.0)
MCV: 93.9 fL (ref 80.0–100.0)
Platelets: 201 10*3/uL (ref 150–400)
RBC: 4.26 MIL/uL (ref 3.87–5.11)
RDW: 13.6 % (ref 11.5–15.5)
WBC: 6.1 10*3/uL (ref 4.0–10.5)
nRBC: 0 % (ref 0.0–0.2)

## 2023-01-30 LAB — BASIC METABOLIC PANEL
Anion gap: 9 (ref 5–15)
BUN: 20 mg/dL (ref 8–23)
CO2: 27 mmol/L (ref 22–32)
Calcium: 8.8 mg/dL — ABNORMAL LOW (ref 8.9–10.3)
Chloride: 104 mmol/L (ref 98–111)
Creatinine, Ser: 0.56 mg/dL (ref 0.44–1.00)
GFR, Estimated: >60 mL/min (ref >60)
Glucose, Bld: 98 mg/dL (ref 70–99)
Potassium: 3.7 mmol/L (ref 3.5–5.1)
Sodium: 140 mmol/L (ref 135–145)

## 2023-01-30 LAB — TROPONIN I (HIGH SENSITIVITY): Troponin I (High Sensitivity): 2 ng/L (ref <18)

## 2023-01-30 MED ORDER — KETOROLAC TROMETHAMINE 30 MG/ML IJ SOLN
15.0000 mg | Freq: Once | INTRAMUSCULAR | Status: AC
  Administered 2023-01-30: 15 mg via INTRAVENOUS
  Filled 2023-01-30: qty 1

## 2023-01-30 MED ORDER — IBUPROFEN 600 MG PO TABS: 600.0000 mg | ORAL_TABLET | Freq: Three times a day (TID) | ORAL | 0 refills | Status: DC | PRN

## 2023-01-30 MED ORDER — METHOCARBAMOL 500 MG PO TABS: 500.0000 mg | ORAL_TABLET | Freq: Two times a day (BID) | ORAL | 0 refills | Status: DC

## 2023-01-30 MED ORDER — DIAZEPAM 2 MG PO TABS
2.0000 mg | ORAL_TABLET | Freq: Once | ORAL | Status: AC
  Administered 2023-01-30: 2 mg via ORAL
  Filled 2023-01-30: qty 1

## 2023-01-30 NOTE — ED Notes (Signed)
2nd troponin not collected. Pt was discharge by physician

## 2023-01-30 NOTE — Telephone Encounter (Signed)
FYI:Pt called in trying to schedule an appointment with [facility]. She is complaining of being short of breath yesterday 01/29/23 and severe left arm pain when she woke up today. I transferred her over to nurse triage.

## 2023-01-30 NOTE — ED Provider Notes (Signed)
[place] [facility]ERGENCY DEPARTMENT AT [facility] Provider Note   CSN: [id number] Arrival date & time: 01/30/23  1252     History  Chief Complaint  Patient presents with   Arm Pain   Chest Pain    [name] [name] is a 62 y.o. female.  62 year old female presents with left arm pain characterized as sharp and worse with movement.  Patient states she awoke with this morning and states that it is atraumatic.  No distal numbness or tingling to her left hand.  No associated cardiac symptomatology.  States she has had off-and-on chest discomfort for quite some time.  Denies any CHF type symptoms.  No treatment use prior to arrival other than Tylenol.  Patient called her doctor and was told to come in here.       Home Medications Prior to Admission medications   Medication Sig Start Date End Date Taking? Authorizing Provider  ASPIRIN 81 PO Take 1 tablet by mouth daily.    [provider]  atorvastatin (LIPITOR) 40 MG tablet Take 1 tablet (40 mg total) by mouth daily. 02/23/22   Fay Records, MD  CALCIUM PO Take by mouth.    [provider]  hydrochlorothiazide (HYDRODIURIL) 12.5 MG tablet Take 1 tablet (12.5 mg total) by mouth daily. 03/02/22   Fay Records, MD  Multiple Vitamin (MULTIVITAMIN) tablet Take 1 tablet by mouth daily.      [provider]  Multiple Vitamins-Minerals (AIRBORNE PO) Take 2 tablets by mouth daily.    [provider]  telmisartan (MICARDIS) 80 MG tablet TAKE 1 TABLET BY MOUTH EVERY DAY 02/23/22   Fay Records, MD  tetrahydrozoline 0.05 % ophthalmic solution Place 1 drop into both eyes daily as needed (for dry eyes).    [provider]      Allergies    Codeine    Review of Systems   Review of Systems  All other systems reviewed and are negative.   Physical Exam Updated Vital Signs BP (!) 143/85 (BP Location: Right Arm)   Pulse 72   Temp 98.3 F (36.8 C)   Resp 14   SpO2 98%  Physical Exam Vitals and  nursing note reviewed.  Constitutional:      General: She is not in acute distress.    Appearance: Normal appearance. She is well-developed. She is not toxic-appearing.  HENT:     Head: Normocephalic and atraumatic.  Eyes:     General: Lids are normal.     Conjunctiva/sclera: Conjunctivae normal.     Pupils: Pupils are equal, round, and reactive to light.  Neck:     Thyroid: No thyroid mass.     Trachea: No tracheal deviation.  Cardiovascular:     Rate and Rhythm: Normal rate and regular rhythm.     Heart sounds: Normal heart sounds. No murmur heard.    No gallop.  Pulmonary:     Effort: Pulmonary effort is normal. No respiratory distress.     Breath sounds: Normal breath sounds. No stridor. No decreased breath sounds, wheezing, rhonchi or rales.  Abdominal:     General: There is no distension.     Palpations: Abdomen is soft.     Tenderness: There is no abdominal tenderness. There is no rebound.  Musculoskeletal:        General: No tenderness. Normal range of motion.       Arms:     Cervical back: Normal range of motion and neck supple.  Comments: Neurovascular intact at left hand  Skin:    General: Skin is warm and dry.     Findings: No abrasion or rash.  Neurological:     Mental Status: She is alert and oriented to person, place, and time. Mental status is at baseline.     GCS: GCS eye subscore is 4. GCS verbal subscore is 5. GCS motor subscore is 6.     Cranial Nerves: No cranial nerve deficit.     Sensory: No sensory deficit.     Motor: Motor function is intact.  Psychiatric:        Attention and Perception: Attention normal.        Speech: Speech normal.        Behavior: Behavior normal.     ED Results / Procedures / Treatments   Labs (all labs ordered are listed, but only abnormal results are displayed) Labs Reviewed  BASIC METABOLIC PANEL  CBC  TROPONIN I (HIGH SENSITIVITY)    EKG EKG Interpretation  Date/Time:  Tuesday January 30 2023 13:08:31  EST Ventricular Rate:  72 PR Interval:    QRS Duration: 100 QT Interval:  395 QTC Calculation: 433 R Axis:   0 Text Interpretation: Normal sinus rhythm Low voltage, precordial leads No significant change since last tracing Confirmed by Lacretia Leigh ([id number]) on 01/30/2023 1:40:37 PM  Radiology DG Chest 2 View  Result Date: 01/30/2023 CLINICAL DATA:  Chest pain EXAM: CHEST - 2 VIEW COMPARISON:  One-view x-ray 04/24/2010 FINDINGS: Surgical clips in the upper abdomen. Overlapping cardiac leads. No consolidation, pneumothorax or effusion. No edema. Normal cardiopericardial silhouette. IMPRESSION: No acute cardiopulmonary disease Electronically Signed   By: Jill Side M.D.   On: 01/30/2023 13:21    Procedures Procedures    Medications Ordered in ED Medications  diazepam (VALIUM) tablet 2 mg (has no administration in time range)  ketorolac (TORADOL) 30 MG/ML injection 15 mg (has no administration in time range)    ED Course/ Medical Decision Making/ A&P                             Medical Decision Making Amount and/or Complexity of Data Reviewed Labs: ordered. Radiology: ordered.  Risk Prescription drug management.  Is EKG per interpretation shows normal sinus rhythm.  No signs of acute ischemic changes. Patient reproducible pain to her arm.  No concern for ACS.  Cardiac markers obtained today in the safe side which were negative.  Chest x-ray per interpretation showed no acute findings.  Medicated with as well as [facility] and feels better.  Will discharge home        Final Clinical Impression(s) / ED Diagnoses Final diagnoses:  None    Rx / DC Orders ED Discharge Orders     None         Lacretia Leigh, MD 01/30/23 1528

## 2023-01-30 NOTE — ED Triage Notes (Addendum)
Pt presents with c/o left arm pain and chest pain. Pt reports she has had some shortness of breath and chest pain on and off for several weeks. Pt reports that in the middle of her night, her left arm was hurting so badly that it woke her up.

## 2023-02-01 DIAGNOSIS — M25512 Pain in left shoulder: Secondary | ICD-10-CM | POA: Diagnosis not present

## 2023-02-13 ENCOUNTER — Telehealth: Payer: Self-pay | Admitting: Internal Medicine

## 2023-02-13 MED ORDER — HYDROCHLOROTHIAZIDE 12.5 MG PO TABS: 12.5000 mg | ORAL_TABLET | Freq: Every day | ORAL | 0 refills | Status: DC

## 2023-02-13 MED ORDER — ATORVASTATIN CALCIUM 40 MG PO TABS: 40.0000 mg | ORAL_TABLET | Freq: Every day | ORAL | 0 refills | Status: DC

## 2023-02-13 MED ORDER — TELMISARTAN 80 MG PO TABS: 80.0000 mg | ORAL_TABLET | Freq: Every day | ORAL | 0 refills | Status: DC

## 2023-02-13 NOTE — Telephone Encounter (Signed)
*  STAT* If patient is at the pharmacy, call can be transferred to refill team.   1. Which medications need to be refilled? (please list name of each medication and dose if known)   atorvastatin (LIPITOR) 40 MG tablet  telmisartan (MICARDIS) 80 MG tablet  hydrochlorothiazide (HYDRODIURIL) 12.5 MG tablet   2. Which pharmacy/location (including street and city if local pharmacy) is medication to be sent to?  [id number] [place],  - [address]  3. Do they need a 30 day or 90 day supply?   90 day  Patient has some of these medications left. Patient has appointment on 4/19.

## 2023-02-13 NOTE — Telephone Encounter (Signed)
Pt's medications were sent to pt's pharmacy as requested. Confirmation received.  

## 2023-03-29 DIAGNOSIS — H2512 Age-related nuclear cataract, left eye: Secondary | ICD-10-CM | POA: Diagnosis not present

## 2023-03-29 DIAGNOSIS — B302 Viral pharyngoconjunctivitis: Secondary | ICD-10-CM | POA: Diagnosis not present

## 2023-03-29 DIAGNOSIS — H02834 Dermatochalasis of left upper eyelid: Secondary | ICD-10-CM | POA: Diagnosis not present

## 2023-03-30 ENCOUNTER — Ambulatory Visit: Payer: BLUE CROSS/BLUE SHIELD | Admitting: Physician Assistant

## 2023-03-30 NOTE — Progress Notes (Deleted)
Office Visit    Patient Name: RUKAYA KLEINSCHMIDT Date of Encounter: 03/30/2023  PCP:  Anne Ng, NP   [facility]  Cardiologist:  Dietrich Pates, MD *** Advanced Practice Provider:  No care team member to display Electrophysiologist:  None  {Press F2 to show EP APP, CHF, sleep or structural heart MD               :[id number]}  { Click here to update then REFRESH NOTE - MD (PCP) or APP (Team Member)  Change PCP Type for MD, Specialty for APP is either Cardiology or Clinical Cardiac Electrophysiology  :[id number]}  Chief Complaint    LAMEEKA SCHLEIFER is a 62 y.o. female presents today for ***   Past Medical History    Past Medical History:  Diagnosis Date   Anemia    hx of years ago   ANXIETY    no meds   ARTHRITIS    ASTHMA    Complication of anesthesia    DEGENERATIVE JOINT DISEASE    EMPHYSEMA, MILD    pt. denies at preop   Family history of adverse reaction to anesthesia    MOM PONV   GALLBLADDER DISEASE, HX OF    HYPERLIPIDEMIA    HYPERTENSION    Multinodular goiter 05/2001 dx   s/p bx: benign   Palpitations    Pharyngitis 07/12/2018   PONV (postoperative nausea and vomiting)    TOBACCO ABUSE    hx of; quit 1/22016   Vitamin D deficiency    Past Surgical History:  Procedure Laterality Date   BREAST BIOPSY     CHOLECYSTECTOMY  2008   JOINT REPLACEMENT Right 1998   total hip   thyroid biopsy  2002   TOTAL HIP REVISION Right 01/23/2019   Procedure: Revision right total hip arthroplasty with acetabular cup with bone graft;  Surgeon: Durene Romans, MD;  Location: [facility];  Service: Orthopedics;  Laterality: Right;  120 mins    Allergies  Allergies  Allergen Reactions   Codeine Nausea And Vomiting and Other (See Comments)    Made the patient "sick"    History of Present Illness    David L Weight is a 62 y.o. female with a hx of *** last seen ***.   EKGs/Labs/Other Studies Reviewed:   The following studies were reviewed today: ***  EKG:   EKG is *** ordered today.  The ekg ordered today demonstrates ***  Recent Labs: 05/24/2022: ALT 17 01/30/2023: BUN 20; Creatinine, Ser 0.56; Hemoglobin 13.3; Platelets 201; Potassium 3.7; Sodium 140  Recent Lipid Panel    Component Value Date/Time   CHOL 137 11/12/2020 1607   TRIG 146 11/12/2020 1607   TRIG 199 10/13/2009 0000   HDL 46 11/12/2020 1607   CHOLHDL 3.0 11/12/2020 1607   CHOLHDL 4 07/30/2017 1548   VLDL 45.0 (H) 07/30/2017 1548   LDLCALC 66 11/12/2020 1607   LDLCALC 188 10/13/2009 0000   LDLDIRECT 126.0 07/30/2017 1548    Risk Assessment/Calculations:  {Does this patient have ATRIAL FIBRILLATION?:[phone]}  Home Medications   No outpatient medications have been marked as taking for the 03/30/23 encounter (Appointment) with Sharlene Dory, PA-C.     Review of Systems   ***   All other systems reviewed and are otherwise negative except as noted above.  Physical Exam    VS:  There were no vitals taken for this visit. , BMI There is no height or weight on file to calculate  BMI.  Wt Readings from Last 3 Encounters:  05/24/22 192 lb 3.2 oz (87.2 kg)  02/20/22 197 lb (89.4 kg)  12/22/21 198 lb 9.6 oz (90.1 kg)     GEN: Well nourished, well developed, in no acute distress. HEENT: normal. Neck: Supple, no JVD, carotid bruits, or masses. Cardiac: ***RRR, no murmurs, rubs, or gallops. No clubbing, cyanosis, edema.  ***Radials/PT 2+ and equal bilaterally.  Respiratory:  ***Respirations regular and unlabored, clear to auscultation bilaterally. GI: Soft, nontender, nondistended. MS: No deformity or atrophy. Skin: Warm and dry, no rash. Neuro:  Strength and sensation are intact. Psych: Normal affect.  Assessment & Plan    ***  No BP recorded.  {Refresh Note OR Click here to enter BP  :1}***      Disposition: Follow up {follow up:15908} with Dietrich Pates, MD or APP.  Signed, Sharlene Dory, PA-C 03/30/2023, 8:05 AM [facility]

## 2023-04-02 ENCOUNTER — Encounter: Payer: Self-pay | Admitting: Nurse Practitioner

## 2023-04-02 ENCOUNTER — Ambulatory Visit (INDEPENDENT_AMBULATORY_CARE_PROVIDER_SITE_OTHER): Payer: BLUE CROSS/BLUE SHIELD | Admitting: Nurse Practitioner

## 2023-04-02 VITALS — BP 120/72 | HR 94 | Temp 98.6°F | Resp 16 | Ht 64.5 in | Wt 195.8 lb

## 2023-04-02 DIAGNOSIS — D2230 Melanocytic nevi of unspecified part of face: Secondary | ICD-10-CM | POA: Insufficient documentation

## 2023-04-02 DIAGNOSIS — E559 Vitamin D deficiency, unspecified: Secondary | ICD-10-CM | POA: Insufficient documentation

## 2023-04-02 DIAGNOSIS — L814 Other melanin hyperpigmentation: Secondary | ICD-10-CM | POA: Insufficient documentation

## 2023-04-02 DIAGNOSIS — L821 Other seborrheic keratosis: Secondary | ICD-10-CM | POA: Insufficient documentation

## 2023-04-02 DIAGNOSIS — L570 Actinic keratosis: Secondary | ICD-10-CM | POA: Insufficient documentation

## 2023-04-02 DIAGNOSIS — J0100 Acute maxillary sinusitis, unspecified: Secondary | ICD-10-CM

## 2023-04-02 DIAGNOSIS — D1801 Hemangioma of skin and subcutaneous tissue: Secondary | ICD-10-CM | POA: Insufficient documentation

## 2023-04-02 DIAGNOSIS — H66001 Acute suppurative otitis media without spontaneous rupture of ear drum, right ear: Secondary | ICD-10-CM | POA: Diagnosis not present

## 2023-04-02 DIAGNOSIS — D225 Melanocytic nevi of trunk: Secondary | ICD-10-CM | POA: Insufficient documentation

## 2023-04-02 MED ORDER — CEFDINIR 300 MG PO CAPS: 300.0000 mg | ORAL_CAPSULE | Freq: Two times a day (BID) | ORAL | 0 refills | Status: DC

## 2023-04-02 MED ORDER — PREDNISONE 20 MG PO TABS: 20.0000 mg | ORAL_TABLET | Freq: Every day | ORAL | 0 refills | Status: DC

## 2023-04-02 NOTE — Progress Notes (Signed)
Acute Office Visit  Subjective:    Patient ID: [name] [name], female    DOB: 1960-12-18, 62 y.o.   MRN: [id number]  Chief Complaint  Patient presents with   decreased hearing    Pt c/o decreased hearing and pain of the right ear    Otalgia  There is pain in the right ear. This is a new problem. The current episode started in the past 7 days. The problem occurs constantly. The problem has been unchanged. There has been no fever. Associated symptoms include headaches, hearing loss, rhinorrhea and a sore throat. Pertinent negatives include no abdominal pain, coughing, diarrhea or ear discharge. Associated symptoms comments: Sinus congestion and horseness. Treatments tried: flonase. There is no history of a chronic ear infection, hearing loss or a tympanostomy tube.   Outpatient Medications Prior to Visit  Medication Sig   ADVIL 200 MG CAPS Take 600 mg by mouth every 6 (six) hours as needed (for pain or headaches).   ADVIL PM 200-38 MG TABS Take 1 tablet by mouth at bedtime as needed (for sleep).   atorvastatin (LIPITOR) 40 MG tablet Take 1 tablet (40 mg total) by mouth daily.   BAYER LOW DOSE 81 MG chewable tablet Chew 81 mg by mouth in the morning.   Calcium Carb-Cholecalciferol (CALCIUM+D3 PO) Take 1 tablet by mouth daily with breakfast.   hydrochlorothiazide (HYDRODIURIL) 12.5 MG tablet Take 1 tablet (12.5 mg total) by mouth daily.   ibuprofen (ADVIL) 600 MG tablet Take 1 tablet (600 mg total) by mouth every 8 (eight) hours as needed.   methocarbamol (ROBAXIN) 500 MG tablet Take 1 tablet (500 mg total) by mouth 2 (two) times daily.   SYSTANE COMPLETE PF 0.6 % SOLN Place 1 drop into both eyes 2 (two) times daily as needed (for dryness or irritation).   telmisartan (MICARDIS) 80 MG tablet Take 1 tablet (80 mg total) by mouth daily.   tobramycin-dexamethasone (TOBRADEX) ophthalmic solution 1 drop every 6 (six) hours.   Zinc Oxide-Vitamin C (ZINC PLUS VITAMIN C PO) Take 1 tablet by mouth daily  with breakfast.   No facility-administered medications prior to visit.   Reviewed past medical and social history.  Review of Systems  HENT:  Positive for ear pain, hearing loss, rhinorrhea and sore throat. Negative for ear discharge.   Respiratory:  Negative for cough.   Gastrointestinal:  Negative for abdominal pain and diarrhea.  Neurological:  Positive for headaches.   Per HPI     Objective:    Physical Exam Vitals and nursing note reviewed.  HENT:     Head: Normocephalic.     Right Ear: Ear canal and external ear normal. Decreased hearing noted. Tenderness present. No drainage. A middle ear effusion is present. There is no impacted cerumen. No mastoid tenderness. Tympanic membrane is injected, erythematous and bulging. Tympanic membrane is not scarred or perforated.     Left Ear: Hearing, tympanic membrane, ear canal and external ear normal.  Cardiovascular:     Rate and Rhythm: Normal rate.     Pulses: Normal pulses.  Pulmonary:     Effort: Pulmonary effort is normal.  Neurological:     Mental Status: She is alert.    BP 120/72 (BP Location: Right Arm, Patient Position: Sitting, Cuff Size: Large)   Pulse 94   Temp 98.6 F (37 C) (Oral)   Resp 16   Ht 5' 4.5" (1.638 m)   Wt 195 lb 12.8 oz (88.8 kg)   SpO2 94%  BMI 33.09 kg/m    No results found for any visits on 04/02/23.     Assessment & Plan:   Problem List Items Addressed This Visit   None Visit Diagnoses     Acute non-recurrent maxillary sinusitis    -  Primary   Relevant Medications   cefdinir (OMNICEF) 300 MG capsule   predniSONE (DELTASONE) 20 MG tablet   Non-recurrent acute suppurative otitis media of right ear without spontaneous rupture of tympanic membrane       Relevant Medications   cefdinir (OMNICEF) 300 MG capsule   predniSONE (DELTASONE) 20 MG tablet      Meds ordered this encounter  Medications   cefdinir (OMNICEF) 300 MG capsule    Sig: Take 1 capsule (300 mg total) by mouth 2  (two) times daily.    Dispense:  14 capsule    Refill:  0    Order Specific Question:   Supervising Provider    Answer:   Nadene Rubins ALFRED [[id number]]   predniSONE (DELTASONE) 20 MG tablet    Sig: Take 1 tablet (20 mg total) by mouth daily with breakfast.    Dispense:  5 tablet    Refill:  0    Order Specific Question:   Supervising Provider    Answer:   Mliss Sax [[id number]]   Return in about 3 months (around 07/02/2023), or if symptoms worsen or fail to improve, for CPE (fasting).  Alysia Penna, NP

## 2023-04-02 NOTE — Patient Instructions (Addendum)
Avoid inserting anything in ear. Ok to use tylenol or ibuprofen for pain

## 2023-04-15 DIAGNOSIS — I2510 Atherosclerotic heart disease of native coronary artery without angina pectoris: Secondary | ICD-10-CM | POA: Insufficient documentation

## 2023-04-15 HISTORY — DX: Atherosclerotic heart disease of native coronary artery without angina pectoris: I25.10

## 2023-04-15 NOTE — Progress Notes (Unsigned)
Cardiology Office Note:    Date:  04/16/2023  ID:  [name] [name], DOB 02/11/1961, MRN [id number] PCP: Anne Ng, NP  [facility] Providers Cardiologist:  Dietrich Pates, MD          Patient Profile:   Coronary calcification Myoview 10/2011: low risk  CT 11/19: Calcium score 110 (94th percentile) TTE 02/01/2018: Mild concentric LVH, EF 60-65, normal wall motion, grade 1 diastolic dysfunction, mild TR, PASP 32 Hyperlipidemia Hypertension Tobacco use       History of Present Illness:   [name] [name] is a 62 y.o. female who returns for f/u of coronary artery Ca2+. She was last seen by Dr. Tenny Craw 02/20/2022.  She is here alone.  She has occasional chest discomfort on the left side of her chest.  This is described as heavy/tight.  She has had this for years.  She also notes shortness of breath with doing yard work.  She went to the emergency room with associated left arm pain in February.  Electrocardiogram demonstrated no acute changes.  Her troponin was negative x 1.  She has not had orthopnea.  She does have some dependent lower extremity edema.  Review of Systems  HENT:         +Recent URI  Gastrointestinal:  Negative for hematochezia and melena.  Genitourinary:  Negative for hematuria.   see the HPI    Studies Reviewed:    EKG: Not done  EKG from 01/30/2023 was reviewed and demonstrated NSR, HR 72, QTc 433, no ST-T wave changes Risk Assessment/Calculations:             Physical Exam:   VS:  BP 128/78   Pulse 92   Ht 5' 4.5" (1.638 m)   Wt 200 lb (90.7 kg)   SpO2 98%   BMI 33.80 kg/m    Wt Readings from Last 3 Encounters:  04/16/23 200 lb (90.7 kg)  04/02/23 195 lb 12.8 oz (88.8 kg)  05/24/22 192 lb 3.2 oz (87.2 kg)    Constitutional:      Appearance: Healthy appearance. Not in distress.  Neck:     Vascular: No carotid bruit. JVD normal.  Pulmonary:     Breath sounds: Normal breath sounds. No wheezing. No rales.  Cardiovascular:     Normal rate. Regular  rhythm. Normal S1. Normal S2.      Murmurs: There is no murmur.  Edema:    Peripheral edema absent.  Abdominal:     Palpations: Abdomen is soft.       ASSESSMENT AND PLAN:   Coronary artery calcification She has a history of coronary artery calcification with a CAC score of 110 (94th percentile) in 2019.  She has had chronic chest pain off and on.  She did have to go to the emergency room in February.  Her troponin was negative.  She does have a family history of coronary artery disease.  She also has a prior history of smoking.  We discussed further testing to include stress testing versus coronary CTA.  I have recommended proceeding with coronary CTA for definitive diagnosis.   Arrange CCTA Rx for nitroglycerin as needed If symptoms improved with NTG and CCTA negative for obstructive coronary artery disease, consider long-acting nitrates Continue ASA 81 mg daily, Lipitor 40 mg daily Follow-up 6 months  Essential hypertension Blood pressure well-controlled.  Continue HCTZ 12.5 mg daily, telmisartan 80 mg daily.  Hyperlipidemia Continue Lipitor 40 mg daily.  Arrange fasting CMET, lipids.  Tricuspid  regurgitation Mild TR by echocardiogram in 2019.  Since it has been 5 years since her last echocardiogram, I have recommended repeating an echocardiogram.     Dispo:  Return in about 6 months (around 10/17/2023) for Routine Follow Up, w/ Dr. Tenny Craw, or Tereso Newcomer, PA-C.  Signed, Tereso Newcomer, PA-C

## 2023-04-16 ENCOUNTER — Ambulatory Visit: Payer: BLUE CROSS/BLUE SHIELD | Attending: Physician Assistant | Admitting: Physician Assistant

## 2023-04-16 ENCOUNTER — Encounter: Payer: Self-pay | Admitting: Physician Assistant

## 2023-04-16 VITALS — BP 128/78 | HR 92 | Ht 64.5 in | Wt 200.0 lb

## 2023-04-16 DIAGNOSIS — I10 Essential (primary) hypertension: Secondary | ICD-10-CM

## 2023-04-16 DIAGNOSIS — R072 Precordial pain: Secondary | ICD-10-CM

## 2023-04-16 DIAGNOSIS — I071 Rheumatic tricuspid insufficiency: Secondary | ICD-10-CM | POA: Insufficient documentation

## 2023-04-16 DIAGNOSIS — I2510 Atherosclerotic heart disease of native coronary artery without angina pectoris: Secondary | ICD-10-CM | POA: Diagnosis not present

## 2023-04-16 DIAGNOSIS — I2584 Coronary atherosclerosis due to calcified coronary lesion: Secondary | ICD-10-CM

## 2023-04-16 DIAGNOSIS — E7800 Pure hypercholesterolemia, unspecified: Secondary | ICD-10-CM

## 2023-04-16 DIAGNOSIS — I361 Nonrheumatic tricuspid (valve) insufficiency: Secondary | ICD-10-CM

## 2023-04-16 DIAGNOSIS — R079 Chest pain, unspecified: Secondary | ICD-10-CM | POA: Diagnosis not present

## 2023-04-16 MED ORDER — METOPROLOL TARTRATE 100 MG PO TABS: ORAL_TABLET | ORAL | 0 refills | Status: DC

## 2023-04-16 MED ORDER — ATORVASTATIN CALCIUM 40 MG PO TABS: 40.0000 mg | ORAL_TABLET | Freq: Every day | ORAL | 3 refills | Status: DC

## 2023-04-16 MED ORDER — NITROGLYCERIN 0.4 MG SL SUBL: 0.4000 mg | SUBLINGUAL_TABLET | SUBLINGUAL | 3 refills | Status: DC | PRN

## 2023-04-16 MED ORDER — TELMISARTAN 80 MG PO TABS: 80.0000 mg | ORAL_TABLET | Freq: Every day | ORAL | 3 refills | Status: DC

## 2023-04-16 MED ORDER — HYDROCHLOROTHIAZIDE 12.5 MG PO TABS: 12.5000 mg | ORAL_TABLET | Freq: Every day | ORAL | 3 refills | Status: DC

## 2023-04-16 NOTE — Assessment & Plan Note (Signed)
Mild TR by echocardiogram in 2019.  Since it has been 5 years since her last echocardiogram, I have recommended repeating an echocardiogram.

## 2023-04-16 NOTE — Patient Instructions (Addendum)
Medication Instructions:  Your physician has recommended you make the following change in your medication:   START Nitroglycerin TAKE ONLY AS NEEDED.. The proper use and anticipated side effects of nitroglycerine has been carefully explained.  If a single episode of chest pain is not relieved by one tablet, the patient will try another within 5 minutes; and if this doesn't relieve the pain, the patient is instructed to call 911 for transportation to an emergency department.   *If you need a refill on your cardiac medications before your next appointment, please call your pharmacy*   Lab Work: TODAY:  BMET  WHEN COME FOR ECHO:  FASTING LIPID & CMET  If you have labs (blood work) drawn today and your tests are completely normal, you will receive your results only by: MyChart Message (if you have MyChart) OR A paper copy in the mail If you have any lab test that is abnormal or we need to change your treatment, we will call you to review the results.   Testing/Procedures: Your physician has requested that you have an echocardiogram. Echocardiography is a painless test that uses sound waves to create images of your heart. It provides your doctor with information about the size and shape of your heart and how well your heart's chambers and valves are working. This procedure takes approximately one hour. There are no restrictions for this procedure. Please do NOT wear cologne, perfume, aftershave, or lotions (deodorant is allowed). Please arrive 15 minutes prior to your appointment time.   Your physician has requested that you have cardiac CT. Cardiac computed tomography (CT) is a painless test that uses an x-ray machine to take clear, detailed pictures of your heart. For further information please visit [email or web address] Please follow instruction sheet BELOW:    Your cardiac CT will be scheduled at one of the below locations:   [facility] [address] [place], [place]  [address] [phone]   Please arrive at the [facility] Entrance (Entrance C2) of [facility] 30 minutes prior to test start time. You can use the FREE valet parking offered at entrance C (encouraged to control the heart rate for the test)  Proceed to the [facility] Radiology Department (first floor) to check-in and test prep.  All radiology patients and guests should use entrance C2 at [facility], accessed from [facility], even though the hospital's physical address listed is [address].       Please follow these instructions carefully (unless otherwise directed):   On the Night Before the Test: Be sure to Drink plenty of water. Do not consume any caffeinated/decaffeinated beverages or chocolate 12 hours prior to your test. Do not take any antihistamines 12 hours prior to your test. If the patient has contrast allergy:  On the Day of the Test: Drink plenty of water until 1 hour prior to the test. Do not eat any food 1 hour prior to test. You may take your regular medications prior to the test.  Take metoprolol (Lopressor) 100 MG two hours prior to test. [facility] If you take Furosemide/Hydrochlorothiazide/Spironolactone, please HOLD on the morning of the test. FEMALES- please wear underwire-free bra if available, avoid dresses & tight clothing       After the Test: Drink plenty of water. After receiving IV contrast, you may experience a mild flushed feeling. This is normal. On occasion, you may experience a mild rash up to 24 hours after the  test. This is not dangerous. If this occurs, you can take Benadryl 25 mg and increase your fluid intake. If you experience trouble breathing, this can be serious. If it is severe call 911 IMMEDIATELY. If it is mild, please call our office.  We will call to schedule your test 2-4 weeks out understanding that some insurance companies will need an authorization prior to the  service being performed.   For non-scheduling related questions, please contact the cardiac imaging nurse navigator should you have any questions/concerns: Rockwell Alexandria, Cardiac Imaging Nurse Navigator Larey Brick, Cardiac Imaging Nurse Navigator [facility] Direct Office Dial: [phone]   For scheduling needs, including cancellations and rescheduling, please call [place], [phone].    Follow-Up: At [facility], you and your health needs are our priority.  As part of our continuing mission to provide you with exceptional heart care, we have created designated Provider Care Teams.  These Care Teams include your primary Cardiologist (physician) and Advanced Practice Providers (APPs -  Physician Assistants and Nurse Practitioners) who all work together to provide you with the care you need, when you need it.  We recommend signing up for the patient portal called "MyChart".  Sign up information is provided on this After Visit Summary.  MyChart is used to connect with patients for Virtual Visits (Telemedicine).  Patients are able to view lab/test results, encounter notes, upcoming appointments, etc.  Non-urgent messages can be sent to your provider as well.   To learn more about what you can do with MyChart, go to [email or web address].    Your next appointment:   6 month(s)  Provider:   Dietrich Pates, MD  or Tereso Newcomer, PA-C         Other Instructions

## 2023-04-16 NOTE — Assessment & Plan Note (Signed)
Continue Lipitor 40 mg daily.  Arrange fasting CMET, lipids. 

## 2023-04-16 NOTE — Assessment & Plan Note (Signed)
Blood pressure well-controlled.  Continue HCTZ 12.5 mg daily, telmisartan 80 mg daily.

## 2023-04-16 NOTE — Assessment & Plan Note (Signed)
She has a history of coronary artery calcification with a CAC score of 110 (94th percentile) in 2019.  She has had chronic chest pain off and on.  She did have to go to the emergency room in February.  Her troponin was negative.  She does have a family history of coronary artery disease.  She also has a prior history of smoking.  We discussed further testing to include stress testing versus coronary CTA.  I have recommended proceeding with coronary CTA for definitive diagnosis.   Arrange CCTA Rx for nitroglycerin as needed If symptoms improved with NTG and CCTA negative for obstructive coronary artery disease, consider long-acting nitrates Continue ASA 81 mg daily, Lipitor 40 mg daily Follow-up 6 months

## 2023-04-17 LAB — BASIC METABOLIC PANEL
BUN/Creatinine Ratio: 23 (ref 12–28)
BUN: 18 mg/dL (ref 8–27)
CO2: 25 mmol/L (ref 20–29)
Calcium: 9.1 mg/dL (ref 8.7–10.3)
Chloride: 100 mmol/L (ref 96–106)
Creatinine, Ser: 0.8 mg/dL (ref 0.57–1.00)
Glucose: 102 mg/dL — ABNORMAL HIGH (ref 70–99)
Potassium: 4.2 mmol/L (ref 3.5–5.2)
Sodium: 141 mmol/L (ref 134–144)
eGFR: 83 mL/min/{1.73_m2} (ref >59)

## 2023-05-01 ENCOUNTER — Telehealth (HOSPITAL_COMMUNITY): Payer: Self-pay | Admitting: *Deleted

## 2023-05-01 NOTE — Telephone Encounter (Signed)
Reaching out to patient to offer assistance regarding upcoming cardiac imaging study; pt verbalizes understanding of appt date/time, parking situation and where to check in, pre-test NPO status and medications ordered, and verified current allergies; name and call back number provided for further questions should they arise  Nike Southwell RN Navigator Cardiac Imaging [facility] [phone] office [phone] cell  Patient to take 100mg metoprolol tartrate two hours prior to her cardiac CT scan. She is aware to arrive at 9:30am. 

## 2023-05-02 ENCOUNTER — Ambulatory Visit (HOSPITAL_COMMUNITY)
Admission: RE | Admit: 2023-05-02 | Discharge: 2023-05-02 | Disposition: A | Discharge status: Home / Self Care | Payer: BLUE CROSS/BLUE SHIELD | Source: Ambulatory Visit | Attending: Physician Assistant | Admitting: Physician Assistant

## 2023-05-02 ENCOUNTER — Encounter: Payer: Self-pay | Admitting: Physician Assistant

## 2023-05-02 DIAGNOSIS — I2584 Coronary atherosclerosis due to calcified coronary lesion: Secondary | ICD-10-CM | POA: Insufficient documentation

## 2023-05-02 DIAGNOSIS — R072 Precordial pain: Secondary | ICD-10-CM | POA: Insufficient documentation

## 2023-05-02 DIAGNOSIS — I2510 Atherosclerotic heart disease of native coronary artery without angina pectoris: Secondary | ICD-10-CM | POA: Insufficient documentation

## 2023-05-02 MED ORDER — NITROGLYCERIN 0.4 MG SL SUBL
SUBLINGUAL_TABLET | SUBLINGUAL | Status: AC
  Filled 2023-05-02: qty 2

## 2023-05-02 MED ORDER — IOHEXOL 350 MG/ML SOLN
100.0000 mL | Freq: Once | INTRAVENOUS | Status: AC | PRN
  Administered 2023-05-02: 100 mL via INTRAVENOUS

## 2023-05-02 MED ORDER — NITROGLYCERIN 0.4 MG SL SUBL
0.8000 mg | SUBLINGUAL_TABLET | SUBLINGUAL | Status: DC | PRN
  Administered 2023-05-02: 0.8 mg via SUBLINGUAL

## 2023-05-02 NOTE — Progress Notes (Signed)
Patient tolerated without distress 

## 2023-05-03 ENCOUNTER — Telehealth: Payer: Self-pay

## 2023-05-03 NOTE — Telephone Encounter (Signed)
Spoke with patient about Ct results and recommendation from Wende Mott - patient made aware of needing lab work (CMET and fasting Lipid panel), patient will contact us back to schedule this as she needs to review her calendar. No questions at this time

## 2023-05-03 NOTE — Telephone Encounter (Signed)
-----   Message from Beatrice Lecher, [place] sent at 05/02/2023  5:16 PM EDT ----- CT demonstrates calcification.  This has been known from from previous studies.  She has minimal plaque in the left main and LAD.  There are no significant blockages. PLAN:  -Patient needs aggressive risk factor management. -Blood pressure has been well-controlled -She was supposed to have fasting CMET, lipids done.  Please arrange.  Goal LDL <55  Tereso Newcomer, PA-C    05/02/2023 5:11 PM

## 2023-05-09 ENCOUNTER — Ambulatory Visit: Payer: BLUE CROSS/BLUE SHIELD | Admitting: Physician Assistant

## 2023-05-18 ENCOUNTER — Other Ambulatory Visit (HOSPITAL_COMMUNITY): Payer: BLUE CROSS/BLUE SHIELD

## 2023-07-19 ENCOUNTER — Ambulatory Visit: Payer: BLUE CROSS/BLUE SHIELD

## 2023-07-19 ENCOUNTER — Ambulatory Visit (HOSPITAL_COMMUNITY): Payer: BLUE CROSS/BLUE SHIELD

## 2024-04-20 ENCOUNTER — Other Ambulatory Visit: Payer: Self-pay | Admitting: Physician Assistant

## 2024-04-28 ENCOUNTER — Other Ambulatory Visit: Payer: Self-pay | Admitting: Physician Assistant

## 2024-05-02 NOTE — Progress Notes (Unsigned)
 Cardiology Clinic Note   Date: 05/06/2024 ID: MELLONY DANZIGER, DOB 03/12/1961, MRN [id number]  Primary Cardiologist:  Ola Berger, MD  Chief Complaint   [name] [name] is a 63 y.o. female who presents to the clinic today for routine follow up.   Patient Profile   [name] [name] is followed by Dr. Avanell Bob for the history outlined below.      Past medical history significant for: Nonobstructive CAD. Coronary CTA 05/02/2023: Coronary calcium  score 123 (88th percentile).  Minimal nonobstructive CAD. Tricuspid regurgitation.  Echo 02/01/2018: EF 60 to 65%.  Mild concentric LVH.  Grade I DD.  Normal RV size/function.  Normal PA pressure.  Trivial MR.  Mild TR. Hypertension.  Hyperlipidemia. Lipid panel 11/12/2020: LDL 66, HDL 46, TG 146, total 137. Former tobacco abuse.  In summary, patient was remotely seen in 2011 by Dr. Avanell Bob for chest pain felt to be GI etiology.  She had a low risk Myoview  in November 2012.  Patient reestablished with Dr. Avanell Bob on 12/20/2017 for chest pain and arm weakness.  CT cardiac scoring showed a calcium  score of 110.  Echo demonstrated normal LV/RV function as detailed above.  Patient was last seen in the office by Marlyse Single, PA-C on 04/16/2023 for routine follow-up.  She reported occasional left-sided chest discomfort described as heavy/tight unchanged for years.  She noted dyspnea doing yard work.  Coronary CTA demonstrated calcium  score 123 as detailed above.     History of Present Illness    Today, patient is doing well. She continues to have chest discomfort that is unchanged for years. She feels it could be related to anxiety. She has never taken prn NTG.  She just resigned from her stressful job in [employer] and hopes this will make a difference in her stress level. She was traveling quite a bit in her job and this was adding to her anxiety. She reports mild dyspnea with heavier exertion such as walking up a steep hill or pulling something heavy while doing yard  work. She walks a lot in her job with no dyspnea. She reports occasional right ankle swelling she attributes to a past injury. No other lower extremity edema, orthopnea or PND. She asks about anxiety medication and is encouraged to discuss with PCP.      ROS: All other systems reviewed and are otherwise negative except as noted in History of Present Illness.  EKGs/Labs Reviewed    EKG Interpretation Date/Time:  Tuesday May 06 2024 08:36:14 EDT Ventricular Rate:  66 PR Interval:  152 QRS Duration:  92 QT Interval:  406 QTC Calculation: 425 R Axis:   17  Text Interpretation: Normal sinus rhythm Low voltage QRS When compared with ECG of 30-Jan-2023 13:08, No significant change Confirmed by Morey Ar [phone]) on 05/06/2024 8:39:24 AM    Physical Exam    VS:  BP 118/62   Pulse 66   Ht 5' 4.5" (1.638 m)   Wt 192 lb 3.2 oz (87.2 kg)   SpO2 97%   BMI 32.48 kg/m  , BMI Body mass index is 32.48 kg/m.  GEN: Well nourished, well developed, in no acute distress. Neck: No JVD or carotid bruits. Cardiac:  RRR. No murmurs. No rubs or gallops.   Respiratory:  Respirations regular and unlabored. Clear to auscultation without rales, wheezing or rhonchi. GI: Soft, nontender, nondistended. Extremities: Radials/DP/PT 2+ and equal bilaterally. No clubbing or cyanosis. No edema.  Skin: Warm and dry, no rash. Neuro: Strength intact.  Assessment & Plan   Nonobstructive CAD/chest discomfort/stress Coronary CTA May 2024 showed calcium  score of 123, minimal nonobstructive CAD.  Patient reports stable chronic chest discomfort for years she relates to anxiety. She just resigned from her stress job in [employer] and hopes this will help improve her stress level.  - Continue atorvastatin , aspirin , as needed SL NTG.  Cardiac murmur/tricuspid regurgitation Echo February 2019 showed normal LV/RV function, Grade I DD, normal PA pressure, trivial MR, mild TR.  Patient reports mild  dyspnea with heavier exertion such as walking up a steep hill or pulling something heavy while doing yard work. No dyspnea with routine walking or other activities.  - Repeat echo as clinically indicated.  Hypertension BP today 118/62. No report of headaches or dizziness.  - Continue telmisartan , HCTZ. - CBC and CMP today.   Hyperlipidemia LDL 66 December 2021, at goal. - Continue atorvastatin . - Repeat lipid panel today.   Disposition: CBC, CMP, lipid panel today. Return in 1 year or sooner as needed.          Signed, Lonell Rives. Freyja Govea, DNP, NP-C

## 2024-05-06 ENCOUNTER — Encounter: Payer: Self-pay | Admitting: Student

## 2024-05-06 ENCOUNTER — Ambulatory Visit: Attending: Student | Admitting: Student

## 2024-05-06 VITALS — BP 118/62 | HR 66 | Ht 64.5 in | Wt 192.2 lb

## 2024-05-06 DIAGNOSIS — I2510 Atherosclerotic heart disease of native coronary artery without angina pectoris: Secondary | ICD-10-CM | POA: Diagnosis not present

## 2024-05-06 DIAGNOSIS — R0789 Other chest pain: Secondary | ICD-10-CM | POA: Diagnosis not present

## 2024-05-06 DIAGNOSIS — F439 Reaction to severe stress, unspecified: Secondary | ICD-10-CM

## 2024-05-06 DIAGNOSIS — I10 Essential (primary) hypertension: Secondary | ICD-10-CM

## 2024-05-06 DIAGNOSIS — E7800 Pure hypercholesterolemia, unspecified: Secondary | ICD-10-CM

## 2024-05-06 DIAGNOSIS — I361 Nonrheumatic tricuspid (valve) insufficiency: Secondary | ICD-10-CM

## 2024-05-06 MED ORDER — TELMISARTAN 80 MG PO TABS: 80.0000 mg | ORAL_TABLET | Freq: Every day | ORAL | 1 refills | Status: DC

## 2024-05-06 MED ORDER — ATORVASTATIN CALCIUM 40 MG PO TABS: 40.0000 mg | ORAL_TABLET | Freq: Every day | ORAL | 0 refills | Status: DC

## 2024-05-06 MED ORDER — NITROGLYCERIN 0.4 MG SL SUBL: 0.4000 mg | SUBLINGUAL_TABLET | SUBLINGUAL | 3 refills | Status: AC | PRN

## 2024-05-06 MED ORDER — HYDROCHLOROTHIAZIDE 12.5 MG PO TABS: 12.5000 mg | ORAL_TABLET | Freq: Every day | ORAL | 3 refills | Status: DC

## 2024-05-06 NOTE — Patient Instructions (Signed)
 Medication Instructions:  Your Physician recommend you continue on your current medication as directed.   Your provider would *If you need a refill on your cardiac medications before your next appointment, please call your pharmacy*  Lab Work: Your provider would like for you to have following labs drawn today CBC, CMet, Lipid Panel.   If you have labs (blood work) drawn today and your tests are completely normal, you will receive your results only by: MyChart Message (if you have MyChart) OR A paper copy in the mail If you have any lab test that is abnormal or we need to change your treatment, we will call you to review the results.  Testing/Procedures: None ordered at this time   Follow-Up: At [facility], you and your health needs are our priority.  As part of our continuing mission to provide you with exceptional heart care, our providers are all part of one team.  This team includes your primary Cardiologist (physician) and Advanced Practice Providers or APPs (Physician Assistants and Nurse Practitioners) who all work together to provide you with the care you need, when you need it.  Your next appointment:   12 month(s)  Provider:   You may see Ola Berger, MD or one of the following Advanced Practice Providers on your designated Care Team:   Laneta Pintos, NP Gildardo Labrador, PA-C Varney Gentleman, PA-C Cadence [place], PA-C Ronald Cockayne, NP Morey Ar, NP    We recommend signing up for the patient portal called "MyChart".  Sign up information is provided on this After Visit Summary.  MyChart is used to connect with patients for Virtual Visits (Telemedicine).  Patients are able to view lab/test results, encounter notes, upcoming appointments, etc.  Non-urgent messages can be sent to your provider as well.   To learn more about what you can do with MyChart, go to [email or web address].   Other Instructions We have sent prescription refills for the following  medications to your pharmacy: Atorvastatin  40 mg, Hydrochlorothiazide  12.5 mg, Nitroglycerin  0.4 mg, Telmisartan  80 mg

## 2024-05-07 ENCOUNTER — Ambulatory Visit: Payer: Self-pay | Admitting: Student

## 2024-05-07 LAB — COMPREHENSIVE METABOLIC PANEL WITH GFR
ALT: 20 IU/L (ref 0–32)
AST: 16 IU/L (ref 0–40)
Albumin: 4.3 g/dL (ref 3.9–4.9)
Alkaline Phosphatase: 98 IU/L (ref 44–121)
BUN/Creatinine Ratio: 22 (ref 12–28)
BUN: 17 mg/dL (ref 8–27)
Bilirubin Total: 0.8 mg/dL (ref 0.0–1.2)
CO2: 24 mmol/L (ref 20–29)
Calcium: 9.4 mg/dL (ref 8.7–10.3)
Chloride: 101 mmol/L (ref 96–106)
Creatinine, Ser: 0.76 mg/dL (ref 0.57–1.00)
Globulin, Total: 2 g/dL (ref 1.5–4.5)
Glucose: 95 mg/dL (ref 70–99)
Potassium: 3.9 mmol/L (ref 3.5–5.2)
Sodium: 142 mmol/L (ref 134–144)
Total Protein: 6.3 g/dL (ref 6.0–8.5)
eGFR: 88 mL/min/{1.73_m2} (ref >59)

## 2024-05-07 LAB — CBC
Hematocrit: 39.1 % (ref 34.0–46.6)
Hemoglobin: 13.2 g/dL (ref 11.1–15.9)
MCH: 31.6 pg (ref 26.6–33.0)
MCHC: 33.8 g/dL (ref 31.5–35.7)
MCV: 94 fL (ref 79–97)
Platelets: 217 10*3/uL (ref 150–450)
RBC: 4.18 x10E6/uL (ref 3.77–5.28)
RDW: 13.2 % (ref 11.7–15.4)
WBC: 5.7 10*3/uL (ref 3.4–10.8)

## 2024-05-07 LAB — LIPID PANEL
Chol/HDL Ratio: 3.3 ratio (ref 0.0–4.4)
Cholesterol, Total: 164 mg/dL (ref 100–199)
HDL: 50 mg/dL (ref >39)
LDL Chol Calc (NIH): 94 mg/dL (ref 0–99)
Triglycerides: 111 mg/dL (ref 0–149)
VLDL Cholesterol Cal: 20 mg/dL (ref 5–40)

## 2024-05-07 NOTE — Progress Notes (Signed)
Lab results released to MyChart

## 2024-05-20 ENCOUNTER — Other Ambulatory Visit: Payer: Self-pay | Admitting: *Deleted

## 2024-05-20 MED ORDER — HYDROCHLOROTHIAZIDE 12.5 MG PO TABS: 12.5000 mg | ORAL_TABLET | Freq: Every day | ORAL | 3 refills | Status: AC

## 2024-05-23 ENCOUNTER — Other Ambulatory Visit: Payer: Self-pay

## 2024-05-23 MED ORDER — ATORVASTATIN CALCIUM 40 MG PO TABS: 40.0000 mg | ORAL_TABLET | Freq: Every day | ORAL | 3 refills | Status: AC

## 2025-01-09 ENCOUNTER — Telehealth: Payer: Self-pay | Admitting: Internal Medicine

## 2025-01-09 NOTE — Telephone Encounter (Signed)
" °*  STAT* If patient is at the pharmacy, call can be transferred to refill team.   1. Which medications need to be refilled? (please list name of each medication and dose if known)   atorvastatin  (LIPITOR) 40 MG tablet  hydrochlorothiazide  (HYDRODIURIL ) 12.5 MG tablet  telmisartan  (MICARDIS ) 80 MG tablet   2. Would you like to learn more about the convenience, safety, & potential cost savings by using the [facility]? No      3. Are you open to using the [facility] (Type [facility]. No    4. Which pharmacy/location (including street and city if local pharmacy) is medication to be sent to?[id number] - [place], [place] - [address]    5. Do they need a 30 day or 90 day supply? 90 day   Pt is out of medication  "

## 2025-01-12 MED ORDER — TELMISARTAN 80 MG PO TABS: 80.0000 mg | ORAL_TABLET | Freq: Every day | ORAL | 0 refills | Status: AC

## 2025-01-12 NOTE — Telephone Encounter (Signed)
 Pt has enough refills of Atorvastatin  and hydrochlorothiazide  until June.  Sent in a refill for Telmisartan .
